# Patient Record
Sex: Female | Born: 1985 | Race: White | Hispanic: No | Marital: Married | Smoking: Never smoker
Health system: Southern US, Community
[De-identification: ages and names within clinical notes are randomized; demographics above are authoritative.]

## PROBLEM LIST (undated history)

## (undated) DIAGNOSIS — R112 Nausea with vomiting, unspecified: Secondary | ICD-10-CM

## (undated) DIAGNOSIS — Z9889 Other specified postprocedural states: Secondary | ICD-10-CM

## (undated) DIAGNOSIS — F419 Anxiety disorder, unspecified: Secondary | ICD-10-CM

## (undated) DIAGNOSIS — F32A Depression, unspecified: Secondary | ICD-10-CM

## (undated) DIAGNOSIS — F329 Major depressive disorder, single episode, unspecified: Secondary | ICD-10-CM

## (undated) HISTORY — PX: KNEE SURGERY: SHX244

## (undated) HISTORY — PX: CHOLECYSTECTOMY: SHX55

## (undated) HISTORY — DX: Depression, unspecified: F32.A

## (undated) HISTORY — DX: Anxiety disorder, unspecified: F41.9

## (undated) HISTORY — DX: Major depressive disorder, single episode, unspecified: F32.9

## (undated) SURGERY — Surgical Case
Anesthesia: *Unknown

---
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
## 2005-12-07 ENCOUNTER — Other Ambulatory Visit: Admission: RE | Admit: 2005-12-07 | Discharge: 2005-12-07 | Payer: Self-pay | Admitting: Obstetrics & Gynecology

## 2006-06-30 ENCOUNTER — Emergency Department: Payer: Self-pay | Admitting: Emergency Medicine

## 2006-12-23 ENCOUNTER — Other Ambulatory Visit: Admission: RE | Admit: 2006-12-23 | Discharge: 2006-12-23 | Payer: Self-pay | Admitting: Obstetrics and Gynecology

## 2008-03-27 ENCOUNTER — Inpatient Hospital Stay (HOSPITAL_COMMUNITY): Admission: AD | Admit: 2008-03-27 | Discharge: 2008-03-27 | Payer: Self-pay | Admitting: Obstetrics and Gynecology

## 2008-04-13 ENCOUNTER — Inpatient Hospital Stay (HOSPITAL_COMMUNITY): Admission: AD | Admit: 2008-04-13 | Discharge: 2008-04-14 | Payer: Self-pay | Admitting: Obstetrics and Gynecology

## 2008-04-17 ENCOUNTER — Ambulatory Visit (HOSPITAL_COMMUNITY): Admission: RE | Admit: 2008-04-17 | Discharge: 2008-04-17 | Payer: Self-pay | Admitting: Obstetrics and Gynecology

## 2008-05-13 ENCOUNTER — Inpatient Hospital Stay (HOSPITAL_COMMUNITY): Admission: AD | Admit: 2008-05-13 | Discharge: 2008-05-15 | Payer: Self-pay | Admitting: Obstetrics and Gynecology

## 2008-06-06 ENCOUNTER — Encounter (INDEPENDENT_AMBULATORY_CARE_PROVIDER_SITE_OTHER): Payer: Self-pay | Admitting: General Surgery

## 2008-06-06 ENCOUNTER — Ambulatory Visit (HOSPITAL_COMMUNITY): Admission: RE | Admit: 2008-06-06 | Discharge: 2008-06-06 | Payer: Self-pay | Admitting: General Surgery

## 2008-12-20 ENCOUNTER — Emergency Department (HOSPITAL_COMMUNITY): Admission: EM | Admit: 2008-12-20 | Discharge: 2008-12-20 | Payer: Self-pay | Admitting: Family Medicine

## 2009-04-02 ENCOUNTER — Ambulatory Visit: Payer: Self-pay | Admitting: Family

## 2009-04-02 ENCOUNTER — Inpatient Hospital Stay (HOSPITAL_COMMUNITY): Admission: AD | Admit: 2009-04-02 | Discharge: 2009-04-02 | Payer: Self-pay | Admitting: Obstetrics and Gynecology

## 2009-10-09 ENCOUNTER — Inpatient Hospital Stay (HOSPITAL_COMMUNITY): Admission: AD | Admit: 2009-10-09 | Discharge: 2009-10-11 | Payer: Self-pay | Admitting: Obstetrics and Gynecology

## 2010-01-08 ENCOUNTER — Ambulatory Visit: Payer: Self-pay | Admitting: Family Medicine

## 2010-01-08 DIAGNOSIS — F329 Major depressive disorder, single episode, unspecified: Secondary | ICD-10-CM

## 2010-01-08 DIAGNOSIS — F3289 Other specified depressive episodes: Secondary | ICD-10-CM | POA: Insufficient documentation

## 2010-01-08 DIAGNOSIS — R42 Dizziness and giddiness: Secondary | ICD-10-CM | POA: Insufficient documentation

## 2010-01-08 LAB — CONVERTED CEMR LAB
BUN: 11 mg/dL (ref 6–23)
Basophils Absolute: 0 10*3/uL (ref 0.0–0.1)
Basophils Relative: 0.6 % (ref 0.0–3.0)
CO2: 29 meq/L (ref 19–32)
Calcium: 9.6 mg/dL (ref 8.4–10.5)
Chloride: 105 meq/L (ref 96–112)
Creatinine, Ser: 0.8 mg/dL (ref 0.4–1.2)
Eosinophils Absolute: 0.1 10*3/uL (ref 0.0–0.7)
Eosinophils Relative: 2.7 % (ref 0.0–5.0)
GFR calc non Af Amer: 99.15 mL/min (ref >60)
Glucose, Bld: 83 mg/dL (ref 70–99)
HCT: 35.8 % — ABNORMAL LOW (ref 36.0–46.0)
Hemoglobin: 12.1 g/dL (ref 12.0–15.0)
Lymphocytes Relative: 40.4 % (ref 12.0–46.0)
Lymphs Abs: 1.5 10*3/uL (ref 0.7–4.0)
MCHC: 33.8 g/dL (ref 30.0–36.0)
MCV: 80.9 fL (ref 78.0–100.0)
Monocytes Absolute: 0.3 10*3/uL (ref 0.1–1.0)
Monocytes Relative: 7.4 % (ref 3.0–12.0)
Neutro Abs: 1.8 10*3/uL (ref 1.4–7.7)
Neutrophils Relative %: 48.9 % (ref 43.0–77.0)
Platelets: 216 10*3/uL (ref 150.0–400.0)
Potassium: 4 meq/L (ref 3.5–5.1)
RBC: 4.42 M/uL (ref 3.87–5.11)
RDW: 16.1 % — ABNORMAL HIGH (ref 11.5–14.6)
Sodium: 141 meq/L (ref 135–145)
TSH: 1.94 microintl units/mL (ref 0.35–5.50)
WBC: 3.6 10*3/uL — ABNORMAL LOW (ref 4.5–10.5)

## 2010-06-04 ENCOUNTER — Ambulatory Visit: Admit: 2010-06-04 | Payer: Self-pay | Admitting: Family Medicine

## 2010-06-16 NOTE — Assessment & Plan Note (Signed)
Summary: NEW TO EST/KN   Vital Signs:  Patient profile:   25 year old female Height:      64 inches Weight:      125 pounds BMI:     21.53 Pulse rate:   74 / minute BP sitting:   110 / 70  (left arm)  Vitals Entered By: Doristine Devoid CMA (January 08, 2010 10:12 AM) CC: NEW EST- sometimes slight lightheadedness throughout the day    History of Present Illness: 25 yo woman here today to establish care.  previous MD- none.  [facility]- [facility].  lightheaded- sxs started 2 weeks ago, occuring multiple times daily.  'like i'm out of it for a second and then i feel fine'.  denies feeling as if she's going to pass out.  'feels like a buzz'.  occurs when standing, changing positions or turning head.  not breast feeding.  limited fluids.  eating regularly but has never eaten breakfast.  doesn't occur at 1 particular time during the day.  denies focal weakness, vertigo.  was started on iron in the hospital but never took it due to complications w/ her son.  depression- previously on Zoloft, hasn't taken in 1-2 weeks.  feels 'fine' w/out it.  no thoughts of hurting herself or babies.  Preventive Screening-Counseling & Management  Alcohol-Tobacco     Alcohol drinks/day: 0     Smoking Status: never  Caffeine-Diet-Exercise     Does Patient Exercise: no      Sexual History:  currently monogamous.        Drug Use:  never.    Current Medications (verified): 1)  Mirena 20 Mcg/24hr Iud (Levonorgestrel) .... As Directed  Allergies (verified): 1)  ! Macrobid  Past History:  Past Medical History: hx of chicken pox Depression Mirena IUD  Past Surgical History: Cholecystectomy ACL reconstruction 2003  Family History: CAD-no HTN-mother DM-no STROKE-no COLON CA-no BREAST CA-maternal great grandmother  Social History: married 2 boys (12/09, 5/11) Dental Assistant- [employer]Smoking Status:  never Does Patient Exercise:  no Sexual History:  currently  monogamous Drug Use:  never  Review of Systems      See HPI  Physical Exam  General:  Well-developed,well-nourished,in no acute distress; alert,appropriate and cooperative throughout examination Neck:  No deformities, masses, or tenderness noted. Lungs:  Normal respiratory effort, chest expands symmetrically. Lungs are clear to auscultation, no crackles or wheezes. Heart:  Normal rate and regular rhythm. S1 and S2 normal without gallop, murmur, click, rub or other extra sounds. Pulses:  +2 carotid, radial, DP Extremities:  no C/C/E Neurologic:  alert & oriented X3, cranial nerves II-XII intact, strength normal in all extremities, gait normal, DTRs symmetrical and normal, and Romberg negative.   Psych:  Cognition and judgment appear intact. Alert and cooperative with normal attention span and concentration. No apparent delusions, illusions, hallucinations   Impression & Recommendations:  Problem # 1:  DIZZINESS (ICD-780.4) Assessment New pt's PE WNL.  check labs.  most likely due to low BP and poor fluid intake.  strongly encouraged pt to increase fluids, liberalize salt, change positions slowly.  if no improvement, pt to call.  will follow. Orders: Venipuncture (16109) TLB-BMP (Basic Metabolic Panel-BMET) (80048-METABOL) TLB-CBC Platelet - w/Differential (85025-CBCD) TLB-TSH (Thyroid Stimulating Hormone) (84443-TSH) Specimen Handling (60454)  Problem # 2:  DEPRESSION (ICD-311) Assessment: New pt asymptomatic currently.  no evidence of post-partum.  will follow.  Complete Medication List: 1)  Mirena 20 Mcg/24hr Iud (Levonorgestrel) .... As directed  Patient  Instructions: 1)  Call if no improvement in the next 1-2 weeks 2)  Increase your fluid intake- goal is 1 bottle of water in morning and afternoon 3)  Eat some salt! 4)  Change positions slowly to allow your body time to adjust 5)  We'll notify you of your lab results 6)  Welcome we're glad to have you! 7)  Hang in  there!

## 2010-07-02 ENCOUNTER — Encounter: Payer: Self-pay | Admitting: Internal Medicine

## 2010-07-02 ENCOUNTER — Ambulatory Visit (INDEPENDENT_AMBULATORY_CARE_PROVIDER_SITE_OTHER): Payer: BC Managed Care – PPO | Admitting: Internal Medicine

## 2010-07-02 DIAGNOSIS — J069 Acute upper respiratory infection, unspecified: Secondary | ICD-10-CM

## 2010-07-02 DIAGNOSIS — J209 Acute bronchitis, unspecified: Secondary | ICD-10-CM

## 2010-07-08 NOTE — Assessment & Plan Note (Signed)
Summary: BAD COUGH/RH   Vital Signs:  Patient profile:   25 year old female Weight:      125 pounds BMI:     21.53 Temp:     98.5 degrees F oral Pulse rate:   60 / minute Resp:     14 per minute BP sitting:   98 / 62  (left arm) Cuff size:   regular  Vitals Entered By: Shonna Chock CMA (July 02, 2010 3:25 PM) CC: Bad cough, hurts when breathing. FYI:  53yr old daughter is getting over RSV, Cough, URI symptoms   CC:  Bad cough, hurts when breathing. FYI:  67yr old daughter is getting over RSV, Cough, and URI symptoms.  History of Present Illness:    Onset 02/14 as NP cough; her 25 yo has RSV. No PMH of asthma; she is non smoker. She  denies productive cough, shortness of breath, wheezing, exertional dyspnea, fever, and hemoptysis.  Associated symtpoms include chronic rhinitis.  She reports purulent nasal discharge, but denies sore throat ( "just scratchy").  The patient denies fever  and wheezing.  The patient also reports frontal headache.  The patient denies bilateral facial pain, tooth pain, and tender adenopathy.  Rx: Tylenol  Current Medications (verified): 1)  Mirena 20 Mcg/24hr Iud (Levonorgestrel) .... As Directed  Allergies: 1)  ! Macrobid  Physical Exam  General:  Appears tired ,in no acute distress; alert,appropriate and cooperative throughout examination Ears:  External ear exam shows no significant lesions or deformities.  Otoscopic examination reveals  wax bilaterally . Hearing is grossly normal bilaterally. Nose:  External nasal examination shows no deformity or inflammation. Nasal mucosa are pink and moist without lesions or exudates. Hyponasal speech; sniffling Mouth:  Oral mucosa and oropharynx without lesions or exudates.  Teeth in good repair. Lungs:  Normal respiratory effort, chest expands symmetrically. Lungs are clear to auscultation, no crackles or wheezes. Heart:  Normal rate and regular rhythm. S1 and S2 normal without gallop, murmur, click, rub  .S4 Skin:  Intact without suspicious lesions or rashes Cervical Nodes:  No lymphadenopathy noted Axillary Nodes:  No palpable lymphadenopathy   Impression & Recommendations:  Problem # 1:  BRONCHITIS-ACUTE (ICD-466.0)  RSV exposure  Her updated medication list for this problem includes:    Azithromycin 250 Mg Tabs (Azithromycin) .Marland Kitchen... As per pack    Hydromet 5-1.5 Mg/42ml Syrp (Hydrocodone-homatropine) .Marland Kitchen... 1 tsp every 6 hrs as needed  Problem # 2:  URI (ICD-465.9)  Her updated medication list for this problem includes:    Hydromet 5-1.5 Mg/3ml Syrp (Hydrocodone-homatropine) .Marland Kitchen... 1 tsp every 6 hrs as needed  Complete Medication List: 1)  Mirena 20 Mcg/24hr Iud (Levonorgestrel) .... As directed 2)  Azithromycin 250 Mg Tabs (Azithromycin) .... As per pack 3)  Hydromet 5-1.5 Mg/34ml Syrp (Hydrocodone-homatropine) .Marland Kitchen.. 1 tsp every 6 hrs as needed  Patient Instructions: 1)  Neti pot once daily - two times a day as needed  2)  Drink as much  NON dairy fluid as you can tolerate for the next few days. Prescriptions: HYDROMET 5-1.5 MG/5ML SYRP (HYDROCODONE-HOMATROPINE) 1 tsp every 6 hrs as needed  #120 cc x 0   Entered and Authorized by:   Marga Melnick MD   Signed by:   Marga Melnick MD on 07/02/2010   Method used:   Print then Give to Patient   RxID:   [id number] AZITHROMYCIN 250 MG TABS (AZITHROMYCIN) as per pack  #1 x 0   Entered and Authorized by:  Marga Melnick MD   Signed by:   Marga Melnick MD on 07/02/2010   Method used:   Print then Give to Patient   RxID:   [id number]    Orders Added: 1)  Est. Patient Level III [41660]

## 2010-07-16 ENCOUNTER — Emergency Department (HOSPITAL_COMMUNITY)
Admission: EM | Admit: 2010-07-16 | Discharge: 2010-07-17 | Disposition: A | Discharge status: Home / Self Care | Payer: BC Managed Care – PPO | Attending: Emergency Medicine | Admitting: Emergency Medicine

## 2010-07-16 DIAGNOSIS — R509 Fever, unspecified: Secondary | ICD-10-CM | POA: Insufficient documentation

## 2010-07-16 DIAGNOSIS — R112 Nausea with vomiting, unspecified: Secondary | ICD-10-CM | POA: Insufficient documentation

## 2010-07-16 DIAGNOSIS — B9789 Other viral agents as the cause of diseases classified elsewhere: Secondary | ICD-10-CM | POA: Insufficient documentation

## 2010-07-16 DIAGNOSIS — R300 Dysuria: Secondary | ICD-10-CM | POA: Insufficient documentation

## 2010-07-16 LAB — URINALYSIS, ROUTINE W REFLEX MICROSCOPIC
Hgb urine dipstick: NEGATIVE
Nitrite: NEGATIVE
Protein, ur: NEGATIVE mg/dL
Specific Gravity, Urine: 1.027 (ref 1.005–1.030)
Urine Glucose, Fasting: NEGATIVE mg/dL
Urobilinogen, UA: 0.2 mg/dL (ref 0.0–1.0)
pH: 5.5 (ref 5.0–8.0)

## 2010-07-16 LAB — DIFFERENTIAL
Basophils Absolute: 0 10*3/uL (ref 0.0–0.1)
Basophils Relative: 0 % (ref 0–1)
Eosinophils Absolute: 0 10*3/uL (ref 0.0–0.7)
Eosinophils Relative: 0 % (ref 0–5)
Lymphocytes Relative: 3 % — ABNORMAL LOW (ref 12–46)
Lymphs Abs: 0.3 10*3/uL — ABNORMAL LOW (ref 0.7–4.0)
Monocytes Absolute: 0.5 10*3/uL (ref 0.1–1.0)
Monocytes Relative: 5 % (ref 3–12)
Neutro Abs: 9.2 10*3/uL — ABNORMAL HIGH (ref 1.7–7.7)
Neutrophils Relative %: 92 % — ABNORMAL HIGH (ref 43–77)

## 2010-07-16 LAB — CBC
HCT: 42.1 % (ref 36.0–46.0)
Hemoglobin: 14 g/dL (ref 12.0–15.0)
MCH: 28.5 pg (ref 26.0–34.0)
MCHC: 33.3 g/dL (ref 30.0–36.0)
MCV: 85.7 fL (ref 78.0–100.0)
Platelets: 185 10*3/uL (ref 150–400)
RBC: 4.91 MIL/uL (ref 3.87–5.11)
RDW: 13.9 % (ref 11.5–15.5)
WBC: 10 10*3/uL (ref 4.0–10.5)

## 2010-07-16 LAB — PREGNANCY, URINE: Preg Test, Ur: NEGATIVE

## 2010-07-16 LAB — POCT I-STAT, CHEM 8
BUN: 15 mg/dL (ref 6–23)
Calcium, Ion: 1.12 mmol/L (ref 1.12–1.32)
Chloride: 104 mEq/L (ref 96–112)
Creatinine, Ser: 0.8 mg/dL (ref 0.4–1.2)
Glucose, Bld: 111 mg/dL — ABNORMAL HIGH (ref 70–99)
HCT: 44 % (ref 36.0–46.0)
Hemoglobin: 15 g/dL (ref 12.0–15.0)
Potassium: 3.2 mEq/L — ABNORMAL LOW (ref 3.5–5.1)
Sodium: 140 mEq/L (ref 135–145)
TCO2: 23 mmol/L (ref 0–100)

## 2010-07-16 LAB — URINE MICROSCOPIC-ADD ON

## 2010-07-31 ENCOUNTER — Encounter: Payer: Self-pay | Admitting: Family Medicine

## 2010-07-31 ENCOUNTER — Ambulatory Visit (INDEPENDENT_AMBULATORY_CARE_PROVIDER_SITE_OTHER): Payer: BC Managed Care – PPO | Admitting: Family Medicine

## 2010-07-31 DIAGNOSIS — J309 Allergic rhinitis, unspecified: Secondary | ICD-10-CM

## 2010-07-31 DIAGNOSIS — R51 Headache: Secondary | ICD-10-CM

## 2010-07-31 DIAGNOSIS — R519 Headache, unspecified: Secondary | ICD-10-CM | POA: Insufficient documentation

## 2010-08-03 LAB — CBC
HCT: 24.4 % — ABNORMAL LOW (ref 36.0–46.0)
HCT: 32.4 % — ABNORMAL LOW (ref 36.0–46.0)
Hemoglobin: 10.7 g/dL — ABNORMAL LOW (ref 12.0–15.0)
Hemoglobin: 8.1 g/dL — ABNORMAL LOW (ref 12.0–15.0)
MCHC: 33.2 g/dL (ref 30.0–36.0)
MCHC: 33.4 g/dL (ref 30.0–36.0)
MCV: 75.5 fL — ABNORMAL LOW (ref 78.0–100.0)
MCV: 75.7 fL — ABNORMAL LOW (ref 78.0–100.0)
Platelets: 196 10*3/uL (ref 150–400)
Platelets: 235 10*3/uL (ref 150–400)
RBC: 3.22 MIL/uL — ABNORMAL LOW (ref 3.87–5.11)
RBC: 4.28 MIL/uL (ref 3.87–5.11)
RDW: 17.4 % — ABNORMAL HIGH (ref 11.5–15.5)
RDW: 17.6 % — ABNORMAL HIGH (ref 11.5–15.5)
WBC: 10.4 10*3/uL (ref 4.0–10.5)
WBC: 9.6 10*3/uL (ref 4.0–10.5)

## 2010-08-03 LAB — RPR: RPR Ser Ql: NONREACTIVE

## 2010-08-04 NOTE — Assessment & Plan Note (Signed)
Summary: hosp f/up 3/1---vomiting , dehydration, now has constant head...   Vital Signs:  Patient profile:   25 year old female Height:      64 inches Weight:      122 pounds Temp:     98.2 degrees F oral Pulse rate:   68 / minute BP sitting:   100 / 58  (left arm)  Vitals Entered By: Jeremy Johann CMA (July 31, 2010 3:46 PM) CC: hosp f/u dehydration, costant headache sine ED   History of Present Illness: 25 yo woman here today for hospital f/u after gastroenteritis.  went to ER on 3/1 and was given IV fluids.  has had a headache every day since.  has been trying to drink more fluids.  HAs are bitemporal and frontal.  had 1 migraine in last 3 weeks (2nd ever).  no longer having nausea, vomiting, diarrhea.  denies hx of seasonal allergies- denies nasal congestion, sneezing.  no fevers.    Current Medications (verified): 1)  Mirena 20 Mcg/24hr Iud (Levonorgestrel) .... As Directed  Allergies (verified): 1)  ! Macrobid  Review of Systems      See HPI  Physical Exam  General:  in no acute distress; alert,appropriate and cooperative throughout examination Head:  NCAT, no TTP over frontal sinuses, mild discomfort w/ palpation of maxillary sinuses Eyes:  no injxn or inflammation Ears:  External ear exam shows no significant lesions or deformities.  Otoscopic examination reveals  wax bilaterally . Hearing is grossly normal bilaterally. Nose:  + turbinate edema, L>R Mouth:  Oral mucosa and oropharynx without lesions or exudates.  Teeth in good repair. Neck:  No deformities, masses, or tenderness noted. Lungs:  Normal respiratory effort, chest expands symmetrically. Lungs are clear to auscultation, no crackles or wheezes. Heart:  Normal rate and regular rhythm. S1 and S2 normal without gallop, murmur, click, rub or other extra sounds. Abdomen:  soft, NT/ND, +BS Neurologic:  alert & oriented X3, cranial nerves II-XII intact, strength normal in all extremities, gait normal, DTRs  symmetrical and normal, and Romberg negative.     Impression & Recommendations:  Problem # 1:  HEADACHE (ICD-784.0) Assessment New pt's HA appears to be sinus/allergy related given turbinate edema and lack of neuro sxs.  start allergy meds, tylenol/ibuprofen as needed.  reviewed supportive care and red flags that should prompt return.  Pt expresses understanding and is in agreement w/ this plan.  Problem # 2:  RHINITIS (ICD-477.9) Assessment: New see above  Complete Medication List: 1)  Mirena 20 Mcg/24hr Iud (Levonorgestrel) .... As directed  Patient Instructions: 1)  This appears to be an allergy/sinus headache 2)  Take Zyrtec daily for nasal and sinus congestion 3)  Drink plenty of fluids 4)  Continue tylenol/ibuprofen as needed for headaches 5)  Call with any questions or concerns 6)  Hang in there!!!   Orders Added: 1)  Est. Patient Level III [16109]

## 2010-08-12 ENCOUNTER — Encounter: Payer: Self-pay | Admitting: Family Medicine

## 2010-08-13 ENCOUNTER — Ambulatory Visit (INDEPENDENT_AMBULATORY_CARE_PROVIDER_SITE_OTHER): Payer: BC Managed Care – PPO | Admitting: Family Medicine

## 2010-08-13 ENCOUNTER — Encounter: Payer: Self-pay | Admitting: Family Medicine

## 2010-08-13 VITALS — BP 100/60 | Temp 98.0°F | Ht 64.0 in | Wt 126.1 lb

## 2010-08-13 DIAGNOSIS — F3289 Other specified depressive episodes: Secondary | ICD-10-CM

## 2010-08-13 DIAGNOSIS — F32A Depression, unspecified: Secondary | ICD-10-CM

## 2010-08-13 DIAGNOSIS — F329 Major depressive disorder, single episode, unspecified: Secondary | ICD-10-CM

## 2010-08-13 DIAGNOSIS — F419 Anxiety disorder, unspecified: Secondary | ICD-10-CM | POA: Insufficient documentation

## 2010-08-13 MED ORDER — CITALOPRAM HYDROBROMIDE 20 MG PO TABS: 20.0000 mg | ORAL_TABLET | Freq: Every day | ORAL | Status: DC

## 2010-08-13 NOTE — Patient Instructions (Signed)
Follow up in 4-6 weeks to recheck mood Start the Celexa daily- if you find it makes you sleepy, take it at dinner Give yourself a break!  You have a lot going on! (and you're completely normal!) Call with any questions or concerns Hang in there!

## 2010-08-13 NOTE — Progress Notes (Signed)
  Subjective:    Patient ID: Renee Patterson, female    DOB: 1986/05/09, 25 y.o.   MRN: [id number]  HPI Depression- worse over the last 2 weeks.  'snapping at husband all the time', 'bad mood all the time'.  Started on Zoloft during 2nd pregnancy b/c she was overwhelmed.  Stopped meds 2 months ago.  Mom of 2 young boys, in school, working full time- 'everything is getting to me'.  Was wondering if it could be side effect of mirena- has been in place for 9 months.  After stopping zoloft initially felt 'really good'.  No family hx of depression.  Restarted school in January.   Review of Systems For ROS see HPI     Objective:   Physical Exam  Constitutional: She appears well-developed and well-nourished. No distress.  Psychiatric: Her speech is normal and behavior is normal. Judgment and thought content normal. Her mood appears anxious. Cognition and memory are normal.          Assessment & Plan:

## 2010-08-13 NOTE — Assessment & Plan Note (Signed)
Pt's depression has likely recurred due to amount of stress in life.  'i don't like myself right now'.  Open to idea of restarting medication.  Will switch to Celexa now that pt is not breast feeding.  Will follow closely.

## 2010-08-19 LAB — GC/CHLAMYDIA PROBE AMP, GENITAL
Chlamydia, DNA Probe: NEGATIVE
GC Probe Amp, Genital: NEGATIVE

## 2010-08-19 LAB — URINE CULTURE
Colony Count: NO GROWTH
Culture: NO GROWTH

## 2010-08-19 LAB — WET PREP, GENITAL
Clue Cells Wet Prep HPF POC: NONE SEEN
Trich, Wet Prep: NONE SEEN
Yeast Wet Prep HPF POC: NONE SEEN

## 2010-08-19 LAB — URINE MICROSCOPIC-ADD ON

## 2010-08-19 LAB — URINALYSIS, ROUTINE W REFLEX MICROSCOPIC
Bilirubin Urine: NEGATIVE
Glucose, UA: NEGATIVE mg/dL
Hgb urine dipstick: NEGATIVE
Ketones, ur: NEGATIVE mg/dL
Nitrite: NEGATIVE
Protein, ur: NEGATIVE mg/dL
Specific Gravity, Urine: 1.01 (ref 1.005–1.030)
Urobilinogen, UA: 0.2 mg/dL (ref 0.0–1.0)
pH: 7.5 (ref 5.0–8.0)

## 2010-08-23 LAB — POCT RAPID STREP A (OFFICE): Streptococcus, Group A Screen (Direct): NEGATIVE

## 2010-08-31 LAB — COMPREHENSIVE METABOLIC PANEL
ALT: 78 U/L — ABNORMAL HIGH (ref 0–35)
AST: 32 U/L (ref 0–37)
Albumin: 4 g/dL (ref 3.5–5.2)
Alkaline Phosphatase: 107 U/L (ref 39–117)
BUN: 12 mg/dL (ref 6–23)
CO2: 28 mEq/L (ref 19–32)
Calcium: 9.4 mg/dL (ref 8.4–10.5)
Chloride: 106 mEq/L (ref 96–112)
Creatinine, Ser: 0.76 mg/dL (ref 0.4–1.2)
GFR calc Af Amer: >60 mL/min (ref >60)
GFR calc non Af Amer: >60 mL/min (ref >60)
Glucose, Bld: 89 mg/dL (ref 70–99)
Potassium: 4.2 mEq/L (ref 3.5–5.1)
Sodium: 141 mEq/L (ref 135–145)
Total Bilirubin: 0.7 mg/dL (ref 0.3–1.2)
Total Protein: 6.7 g/dL (ref 6.0–8.3)

## 2010-08-31 LAB — CBC
HCT: 33.1 % — ABNORMAL LOW (ref 36.0–46.0)
Hemoglobin: 11 g/dL — ABNORMAL LOW (ref 12.0–15.0)
MCHC: 33.3 g/dL (ref 30.0–36.0)
MCV: 85.9 fL (ref 78.0–100.0)
Platelets: 204 10*3/uL (ref 150–400)
RBC: 3.85 MIL/uL — ABNORMAL LOW (ref 3.87–5.11)
RDW: 14.7 % (ref 11.5–15.5)
WBC: 3.9 10*3/uL — ABNORMAL LOW (ref 4.0–10.5)

## 2010-08-31 LAB — PREGNANCY, URINE: Preg Test, Ur: NEGATIVE

## 2010-08-31 LAB — DIFFERENTIAL
Basophils Absolute: 0 10*3/uL (ref 0.0–0.1)
Basophils Relative: 1 % (ref 0–1)
Eosinophils Absolute: 0.1 10*3/uL (ref 0.0–0.7)
Eosinophils Relative: 4 % (ref 0–5)
Lymphocytes Relative: 33 % (ref 12–46)
Lymphs Abs: 1.3 10*3/uL (ref 0.7–4.0)
Monocytes Absolute: 0.3 10*3/uL (ref 0.1–1.0)
Monocytes Relative: 7 % (ref 3–12)
Neutro Abs: 2.2 10*3/uL (ref 1.7–7.7)
Neutrophils Relative %: 56 % (ref 43–77)

## 2010-09-29 NOTE — Op Note (Signed)
Renee Patterson, Renee Patterson                  ACCOUNT NO.:  [id number]   MEDICAL RECORD NO.:  [id number]          PATIENT TYPE:  AMB   LOCATION:  DAY                          FACILITY[facility]  [facility]   PHYSICIAN:  Juanetta Gosling, MDDATE OF BIRTH:  [birth year]   DATE OF PROCEDURE:  06/06/2008  DATE OF DISCHARGE:                               OPERATIVE REPORT   PREOPERATIVE DIAGNOSES:  1. Biliary colic.  2. Elevated liver function tests.   POSTOPERATIVE DIAGNOSES:  1. Biliary colic.  2. Elevated liver function tests.   PROCEDURE:  Laparoscopic cholecystectomy with cholangiogram.   SURGEON:  Juanetta Gosling, MD   ASSISTANT:  Velora Heckler, MD   ANESTHESIA:  General.   FINDINGS:  Normal cholangiogram.   SPECIMEN:  Gallbladder and contents to pathology.   ESTIMATED BLOOD LOSS:  Minimal.   COMPLICATIONS:  None.   DRAINS:  None.   DISPOSITION:  To recovery room in stable condition.   INDICATIONS:  Mrs. Kil is a 25 year old female whom I initially saw at  [redacted] weeks pregnant with classic symptoms of biliary colic.  We planned to  do a laparoscopic cholecystectomy after she delivered.  She delivered 3  weeks ago and now is having consistent right upper quadrant pain.  Her  ultrasound shows numerous tiny gallstones.  She did have a mildly  elevated transaminases and alkaline phosphatase with a normal total  bilirubin preoperatively, so I counseled her for laparoscopic  cholecystectomy with a cholangiogram.   PROCEDURE:  After informed consent was obtained, the patient was taken  to the operating room where she was administered 1 gram of cefoxitin.  Sequential compression devices were placed on lower extremites prior to  induction.  She was placed under general endotracheal anesthesia without  complication.  Her abdomen was then prepped and draped in a sterile  surgical fashion.  Surgical time-out was then performed.   A 10-mm vertical infraumbilical incision was then made, carried out  down  to the level of fascia which was incised sharply.  Peritoneum was then  entered bluntly.  Zero Vicryl pursestring suture was placed in her  fascia and a Hasson trocar was introduced.  Her abdomen was then  insufflated to 15 mmHg pressure.  Three further 5-mm ports were then  placed in her epigastrium and right upper quadrant after infiltration  with local anesthetic under direct vision.  Her gallbladder was then  retracted cephalad and lateral.  The triangle of Calot was then  dissected.  The cystic duct and cystic artery were both clearly  identified obtaining the critical view of safety.  Due to her abnormal  LFTs, I did perform a cholangiogram.  A clip was placed on the distal  cystic duct.  A ductotomy was then made with scissors and a Cook  catheter was introduced into the cystic duct and clipped into place a  cholangiogram was then performed which showed no evidence of any stones,  filling of both hepatic radicles, filling of the duodenum and the  position of the catheter in the cystic duct.  The catheter was then  removed, clips were applied to the cystic duct, the cystic duct was then  divided, the cystic artery was then encircled and clipped and divided as  well.  The gallbladder was then removed from the liver bed with the  electrocautery.  There were some small areas of bleeding and hemostasis  was obtained.  One of the clips fell off the gallbladder.  There was a  small amount of leakage of bile during this portion as well.  Following  this, the gallbladder was then placed in the EndoCatch and removed out  of the umbilicus and passed off the table as specimen.  Copious  irrigation was performed until this was clear.  Hemostasis was observed.  The umbilical incision was then tied down.  I did place an additional  figure-of-eight stitch with a 0 Vicryl in the umbilical incision to  close her fascia.  Following this, all ports were removed and the  abdomen was desufflated.   4-0 Monocryl in subcuticular fashion was used  to close all the wounds and Dermabond was placed over all the wounds.  She tolerated this well, was extubated in the operating room and was  transferred to the recovery room in stable condition.      Juanetta Gosling, MD  Electronically Signed     MCW/MEDQ  D:  06/06/2008  T:  06/06/2008  Job:  [id number]   cc:   Dineen Kid. Rana Snare, M.D.  Fax: [phone]

## 2010-12-07 ENCOUNTER — Encounter: Payer: Self-pay | Admitting: Family Medicine

## 2010-12-07 ENCOUNTER — Ambulatory Visit (INDEPENDENT_AMBULATORY_CARE_PROVIDER_SITE_OTHER): Payer: BC Managed Care – PPO | Admitting: Family Medicine

## 2010-12-07 VITALS — BP 100/70 | Temp 99.0°F | Wt 120.0 lb

## 2010-12-07 DIAGNOSIS — D485 Neoplasm of uncertain behavior of skin: Secondary | ICD-10-CM | POA: Insufficient documentation

## 2010-12-07 NOTE — Assessment & Plan Note (Signed)
Pt's hypopigmented area appears to be sunburned which would account for the tenderness to touch.  Given family hx of skin cancer and obvious changes to area, will refer to derm for complete evaluation and tx.  Pt expressed understanding and is in agreement w/ plan.

## 2010-12-07 NOTE — Progress Notes (Signed)
  Subjective:    Patient ID: Renee Patterson, female    DOB: [birth year], 25 y.o.   MRN: [id number]  HPI Irritated mole- has had mole for 'as long as i can remember'.  2-3 yrs ago after tanning noticed white halo around mole.  Subsequently noticed a faint black dot.  In-laws this weekend noticed that mole 'was gone' and pale pink spot is now in it's place- white halo remains.  Now tender to touch.  No bleeding, no scab or irregular texture.   Review of Systems For ROS see HPI     Objective:   Physical Exam  Constitutional: She appears well-developed and well-nourished. No distress.  Skin:             Assessment & Plan:

## 2010-12-07 NOTE — Patient Instructions (Signed)
We'll notify you of your derm appt There is nothing you need to do- make sure that you apply sunscreen to that area Call with any questions or concerns Have a great summer!

## 2010-12-09 ENCOUNTER — Ambulatory Visit: Payer: BC Managed Care – PPO | Admitting: Family Medicine

## 2010-12-25 ENCOUNTER — Other Ambulatory Visit: Payer: Self-pay | Admitting: Surgery

## 2011-01-06 ENCOUNTER — Telehealth: Payer: Self-pay | Admitting: Family Medicine

## 2011-01-06 NOTE — Telephone Encounter (Signed)
Pt was able to remember things at her appt ~1 month ago (was able to describe her vacation in detail).  Since this is obviously a change for her i recommended she go to ER.  Pt refused.  Find this odd b/c it was bothersome enough for pt to call but then told Karoline Caldwell that 'it's not a big problem, it's just weird'.  Please call her in the morning and check on her to ensure she is ok.

## 2011-01-06 NOTE — Telephone Encounter (Signed)
Pt calls with complaint of memory loss. Pt can't remember any of her childhood,high school graduation ex.  Pt says it's been going on for as long as she can remember. Spoke with Dr. Beverely Low, was advised to tell patient to go to ER. Pt refused stating that she didn't think this was "that serious."

## 2011-01-08 NOTE — Telephone Encounter (Signed)
Left message to call office

## 2011-01-11 NOTE — Telephone Encounter (Signed)
Pt states that she is doing fine and it was nothing that happen all the sudden. Pt indicate that over time she has notices that she is having a hard time recalling different events from her childhood.Pt notes that this has been on going for a while and she was concern because she did not want to start to forget current events. Pt advise OV would be needed, Pt states that she is currently at doctor with her children will call when she leave there to schedule OV for evaluation

## 2011-02-10 ENCOUNTER — Ambulatory Visit (INDEPENDENT_AMBULATORY_CARE_PROVIDER_SITE_OTHER): Payer: BC Managed Care – PPO | Admitting: Family Medicine

## 2011-02-10 ENCOUNTER — Encounter: Payer: Self-pay | Admitting: Family Medicine

## 2011-02-10 ENCOUNTER — Ambulatory Visit: Payer: BC Managed Care – PPO | Admitting: Family Medicine

## 2011-02-10 VITALS — BP 120/80 | Temp 99.0°F | Wt 120.0 lb

## 2011-02-10 DIAGNOSIS — R413 Other amnesia: Secondary | ICD-10-CM

## 2011-02-10 LAB — CBC WITH DIFFERENTIAL/PLATELET
Basophils Absolute: 0 10*3/uL (ref 0.0–0.1)
Basophils Relative: 0.4 % (ref 0.0–3.0)
Eosinophils Absolute: 0.1 10*3/uL (ref 0.0–0.7)
Eosinophils Relative: 1.9 % (ref 0.0–5.0)
HCT: 40.5 % (ref 36.0–46.0)
Hemoglobin: 13.8 g/dL (ref 12.0–15.0)
Lymphocytes Relative: 18.1 % (ref 12.0–46.0)
Lymphs Abs: 0.9 10*3/uL (ref 0.7–4.0)
MCHC: 34.1 g/dL (ref 30.0–36.0)
MCV: 89.7 fl (ref 78.0–100.0)
Monocytes Absolute: 0.5 10*3/uL (ref 0.1–1.0)
Monocytes Relative: 9.3 % (ref 3.0–12.0)
Neutro Abs: 3.7 10*3/uL (ref 1.4–7.7)
Neutrophils Relative %: 70.3 % (ref 43.0–77.0)
Platelets: 184 10*3/uL (ref 150.0–400.0)
RBC: 4.52 Mil/uL (ref 3.87–5.11)
RDW: 13.6 % (ref 11.5–14.6)
WBC: 5.2 10*3/uL (ref 4.5–10.5)

## 2011-02-10 LAB — BASIC METABOLIC PANEL
BUN: 10 mg/dL (ref 6–23)
Calcium: 9.6 mg/dL (ref 8.4–10.5)
Creatinine, Ser: 0.6 mg/dL (ref 0.4–1.2)

## 2011-02-10 LAB — TSH: TSH: 1.04 u[IU]/mL (ref 0.35–5.50)

## 2011-02-10 NOTE — Progress Notes (Signed)
  Subjective:    Patient ID: Renee Patterson, female    DOB: 1985/07/01, 25 y.o.   MRN: [id number]  HPI Memory loss- reports it's starting to affect work.  Mom has always said pt was 'forgetful'.  Will look at pictures from childhood trips but not remember going.  Graduated HS in 2005- doesn't remember where it was, who attended.  Will talk to a patient at work and 'completely forget what we talked about'.  Pt feels she should remember more than she does.  Is starting to worry that she will forget her children's milestones.  Remembers wedding day, birth of children but 'it's like there are some things in my life that are just blank'.  Will forget to pay bills, 'it's driving my husband crazy'.  Denies difficulty completing tasks, staying focused.  Needs to write things down.   Review of Systems For ROS see HPI     Objective:   Physical Exam  Vitals reviewed. Constitutional: She is oriented to person, place, and time. She appears well-developed and well-nourished. No distress.  Neck: Normal range of motion. Neck supple. No thyromegaly present.  Neurological: She is alert and oriented to person, place, and time. No cranial nerve deficit. Coordination normal.  Skin: Skin is warm and dry.  Psychiatric: She has a normal mood and affect. Her behavior is normal. Judgment and thought content normal.          Assessment & Plan:

## 2011-02-10 NOTE — Assessment & Plan Note (Signed)
Unclear as to cause of pt's sxs.  Given her young age will refer to neuro for complete w/u.  Will check labs to r/o thyroid, vit deficiencies.  Pt expressed understanding and is in agreement w/ plan.

## 2011-02-10 NOTE — Progress Notes (Signed)
Quick Note:    Labs mailed.  ______

## 2011-02-10 NOTE — Patient Instructions (Signed)
We'll call you with your neuro appt We'll notify you of your lab results Hang in there!!!

## 2011-02-12 ENCOUNTER — Ambulatory Visit: Payer: BC Managed Care – PPO | Admitting: Family Medicine

## 2011-02-15 LAB — VITAMIN B1: Vitamin B1 (Thiamine): 12 nmol/L (ref 9–44)

## 2011-02-16 ENCOUNTER — Telehealth: Payer: Self-pay | Admitting: Family Medicine

## 2011-02-16 ENCOUNTER — Telehealth: Payer: Self-pay

## 2011-02-16 NOTE — Telephone Encounter (Signed)
Patient scheduled to see Dr. Modesto Charon 02-26-11 (currently his 1st avail).  She is on his cancellation list, however, patient is calling everyday wanting sooner appointment with Neuro.  Patient is consumed with worry, states her memory is getting even worse.  Once receiving her normal lab results today, she is even more a mess that something bad is wrong.  Per my call with patient this afternoon, she was in tears, and stating how embarrassing this memory loss has become.  Patient is asking if there is anything further Dr. Beverely Low can do to help her right now.  Also, per her request, I am checking with Diane of [facility] to see if they have any cancellations if they will see patient right away.  Please advise.

## 2011-02-16 NOTE — Telephone Encounter (Signed)
Left message to notify pt of results. Pt advised to call with questions or concerns

## 2011-02-16 NOTE — Telephone Encounter (Signed)
Message copied by Beverely Low on Tue Feb 16, 2011 11:36 AM ------      Message from: Sheliah Hatch      Created: Mon Feb 15, 2011  8:35 PM       normal

## 2011-02-17 LAB — COMPREHENSIVE METABOLIC PANEL
ALT: 68 — ABNORMAL HIGH
AST: 82 — ABNORMAL HIGH
Albumin: 2.9 — ABNORMAL LOW
Alkaline Phosphatase: 160 — ABNORMAL HIGH
Chloride: 107
GFR calc Af Amer: >60
Potassium: 3.4 — ABNORMAL LOW
Total Bilirubin: 0.8

## 2011-02-17 LAB — CBC
HCT: 35 — ABNORMAL LOW
Hemoglobin: 12
MCV: 88.8
Platelets: 173
RDW: 13.3
WBC: 8.9

## 2011-02-17 LAB — URINALYSIS, ROUTINE W REFLEX MICROSCOPIC
Glucose, UA: NEGATIVE
Hgb urine dipstick: NEGATIVE
Ketones, ur: NEGATIVE
Protein, ur: NEGATIVE
pH: 7

## 2011-02-17 MED ORDER — ALPRAZOLAM 0.25 MG PO TABS: 0.2500 mg | ORAL_TABLET | ORAL | Status: DC | PRN

## 2011-02-17 NOTE — Telephone Encounter (Signed)
Pt aware per Luster Landsberg

## 2011-02-17 NOTE — Telephone Encounter (Signed)
There is nothing else to do at this time except treat her overwhelming anxiety about this situation.  She can have xanax 0.25mg  Q4 prn for panic but in regards to the memory loss we have to wait for neuro.

## 2011-02-17 NOTE — Telephone Encounter (Signed)
Rx faxed to pharmacy  

## 2011-02-19 LAB — CBC
HCT: 31.8 % — ABNORMAL LOW (ref 36.0–46.0)
HCT: 37.5 % (ref 36.0–46.0)
Hemoglobin: 10.9 g/dL — ABNORMAL LOW (ref 12.0–15.0)
Hemoglobin: 12.6 g/dL (ref 12.0–15.0)
MCHC: 33.5 g/dL (ref 30.0–36.0)
MCHC: 34.4 g/dL (ref 30.0–36.0)
MCV: 88.2 fL (ref 78.0–100.0)
MCV: 88.3 fL (ref 78.0–100.0)
Platelets: 141 10*3/uL — ABNORMAL LOW (ref 150–400)
Platelets: 157 10*3/uL (ref 150–400)
RBC: 3.6 MIL/uL — ABNORMAL LOW (ref 3.87–5.11)
RBC: 4.25 MIL/uL (ref 3.87–5.11)
RDW: 13.8 % (ref 11.5–15.5)
RDW: 13.9 % (ref 11.5–15.5)
WBC: 12.2 10*3/uL — ABNORMAL HIGH (ref 4.0–10.5)
WBC: 9.1 10*3/uL (ref 4.0–10.5)

## 2011-02-19 LAB — RPR: RPR Ser Ql: NONREACTIVE

## 2011-02-22 ENCOUNTER — Telehealth: Payer: Self-pay

## 2011-02-22 NOTE — Telephone Encounter (Signed)
Call from patient and she sated she went to [facility] this morning and she felt like she was not taken seriously. She stated she was offered an MRI if she wanted but if she did not it was ok, she fel like her questions were not answered and she wanted to know your opinion. She has an apt with [facility] for a second opinion and she wanted to know do you think, do you think she should get the MRI and keep the apt for the second opinion. Patient can be reached at work tomorrow [phone] press zero to speak with patient. Please advise     KP

## 2011-02-23 ENCOUNTER — Ambulatory Visit: Payer: BC Managed Care – PPO | Admitting: Neurology

## 2011-02-24 NOTE — Telephone Encounter (Signed)
Not who she saw at [facility] but if she didn't feel like they heard what she was saying or taking her seriously I would get the 2nd opinion before proceeding w/ a very expensive test.

## 2011-02-24 NOTE — Telephone Encounter (Signed)
Left message on personally identified voicemail to notify pt of Dr. Rennis Golden message

## 2011-02-26 ENCOUNTER — Ambulatory Visit (INDEPENDENT_AMBULATORY_CARE_PROVIDER_SITE_OTHER): Payer: BC Managed Care – PPO | Admitting: Neurology

## 2011-02-26 ENCOUNTER — Ambulatory Visit: Payer: BC Managed Care – PPO | Admitting: Neurology

## 2011-02-26 ENCOUNTER — Encounter: Payer: Self-pay | Admitting: Neurology

## 2011-02-26 VITALS — BP 116/72 | HR 80 | Ht 64.0 in | Wt 121.0 lb

## 2011-02-26 DIAGNOSIS — R413 Other amnesia: Secondary | ICD-10-CM
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
## 2011-02-26 NOTE — Patient Instructions (Signed)
We will call you with your appointment for the memory testing at [facility] [address] [place], [place]  [phone]  

## 2011-04-13 ENCOUNTER — Telehealth: Payer: Self-pay | Admitting: Neurology

## 2011-04-13 NOTE — Telephone Encounter (Signed)
Questions and concerns about referral to memory testing

## 2011-04-14 NOTE — Telephone Encounter (Signed)
Left msg at [phone] for pt to call

## 2011-04-27 NOTE — Telephone Encounter (Signed)
Letter sent to pt by Misty Stanley

## 2011-05-12 ENCOUNTER — Encounter: Payer: Self-pay | Admitting: Internal Medicine

## 2011-05-12 ENCOUNTER — Ambulatory Visit (INDEPENDENT_AMBULATORY_CARE_PROVIDER_SITE_OTHER): Payer: BC Managed Care – PPO | Admitting: Internal Medicine

## 2011-05-12 VITALS — BP 106/70 | HR 126 | Temp 101.8°F | Wt 119.4 lb

## 2011-05-12 DIAGNOSIS — R509 Fever, unspecified: Secondary | ICD-10-CM

## 2011-05-12 LAB — POCT INFLUENZA A/B: Influenza A, POC: NEGATIVE

## 2011-05-12 MED ORDER — OSELTAMIVIR PHOSPHATE 75 MG PO CAPS: 75.0000 mg | ORAL_CAPSULE | Freq: Two times a day (BID) | ORAL | Status: AC

## 2011-05-12 NOTE — Patient Instructions (Addendum)
Zicam Melts or Zinc lozenges ; vitamin C 2000 mg daily; & Echinacea for 4-7 days. Report fever, exudate("pus") or progressive pain. Plain Mucinex for thick secretions ;force NON dairy fluids, up to 8 ounces every hour while awake . Use a Neti pot daily as needed for sinus congestion . Please report persistent or progressive symptoms over the next 24 hours. NSAIDS ( Aleve, Advil, Naproxen) or Tylenol every 4 hrs as needed for fever as discussed based on label recommendations

## 2011-05-12 NOTE — Progress Notes (Signed)
  Subjective:    Patient ID: Renee Patterson, female    DOB: [birth year], 25 y.o.   MRN: [id number]  HPI Respiratory tract infection Onset/symptoms:12/24 eve as chills , nasal congestion , temp to 99 Exposures (illness/environmental/extrinsic):friend @ work with [employer] several weeks ago Progression of symptoms:to headache, chills, dry cough Treatments/response:NSAIDS with fever response Present symptoms: Frontal headache:yes Facial pain:no Nasal purulence:no Sore throat:no Dental pain:no Lymphadenopathy:no Wheezing/shortness of breath:no Cough/sputum/hemoptysis:dry  Associated extrinsic/allergic symptoms:itchy eyes/ sneezing:some sneezing Past medical history: Seasonal allergies: no/asthma:no Smoking history:never           Review of Systems     Objective:   Physical Exam General appearance: thin but well nourished ;in  no acute distress or increased work of breathing is present but appearsfatigued.    Shotty cervical   lymphadenopathy ; none in  axilla    Eyes: No conjunctival inflammation or lid edema is present.   Ears:  External ear exam shows no significant lesions or deformities.  Otoscopic examination reveals clear canals, tympanic membranes are intact bilaterally without bulging, retraction, inflammation or discharge.  Nose:  External nasal examination shows no deformity or inflammation. Nasal mucosa are dry without lesions or exudates. No septal dislocation. Bilateral  obstruction to airflow.   Oral exam: Dental hygiene is good; lips and gums are healthy appearing.There is no oropharyngeal erythema or exudate noted.   Neck:   Supple with full range of motion without pain.   Heart:  Tachycardia; regular rhythm. Flow murmur w/o click, rub or other extra sounds.   Lungs:Chest clear to auscultation; no wheezes, rhonchi,rales ,or rubs present.No increased work of breathing.    Extremities:  No  Edema or clubbing  noted    Skin: Warm & dry w/o jaundice or tenting.            Assessment & Plan:   #1 febrile illness with nonproductive cough; clinically influenza suggested despite the negative swab. Clinically she is not dehydrated but does have a tachycardia.  Tamiflu will be prescribed. Symptomatic therapy will be pursued. CBC and differential will be indicated if she does not have rapid improvement.

## 2011-06-11 ENCOUNTER — Encounter: Payer: Self-pay | Admitting: Family Medicine

## 2011-06-11 ENCOUNTER — Telehealth: Payer: Self-pay | Admitting: *Deleted

## 2011-06-11 ENCOUNTER — Ambulatory Visit (INDEPENDENT_AMBULATORY_CARE_PROVIDER_SITE_OTHER): Payer: BC Managed Care – PPO | Admitting: Family Medicine

## 2011-06-11 VITALS — BP 109/70 | HR 95 | Temp 98.3°F | Ht 65.0 in | Wt 121.6 lb

## 2011-06-11 DIAGNOSIS — R071 Chest pain on breathing: Secondary | ICD-10-CM

## 2011-06-11 DIAGNOSIS — R0781 Pleurodynia: Secondary | ICD-10-CM | POA: Insufficient documentation

## 2011-06-11 LAB — D-DIMER, QUANTITATIVE: D-Dimer, Quant: 0.22 ug/mL-FEU (ref 0.00–0.48)

## 2011-06-11 MED ORDER — NAPROXEN 500 MG PO TABS: 500.0000 mg | ORAL_TABLET | Freq: Two times a day (BID) | ORAL | Status: DC

## 2011-06-11 NOTE — Progress Notes (Signed)
  Subjective:    Patient ID: Renee Patterson, female    DOB: 1985-11-12, 26 y.o.   MRN: [id number]  HPI Back pain- sxs started 1 week ago.  Thoracic.  Intermittent.  Occurs w/ inspiration and worsens as the day goes on.  Had flu over the holidays- took Tamiflu.  Still having nasal drainage, sneezing.  No cough.  No recent immobility.  Does heavy lifting- 2 boys.  No hx of similar.   Review of Systems For ROS see HPI     Objective:   Physical Exam  Vitals reviewed. Constitutional: She appears well-developed and well-nourished. No distress.  HENT:  Head: Normocephalic and atraumatic.  Neck: Normal range of motion. Neck supple.  Cardiovascular: Normal rate, regular rhythm, normal heart sounds and intact distal pulses.  Exam reveals no friction rub.   No murmur heard. Pulmonary/Chest: Effort normal and breath sounds normal. No respiratory distress. She has no wheezes. She has no rales. She exhibits no tenderness.  Musculoskeletal: She exhibits no edema.       Normal flexion and extension of spine, no TTP over thoracic vertebra.  Mild TTP over paraspinal muscles  Lymphadenopathy:    She has no cervical adenopathy.          Assessment & Plan:

## 2011-06-11 NOTE — Patient Instructions (Signed)
We'll notify you of your lab results and determine whether you need a CT scan Take the Naproxen twice daily until pain improves Use a heating pad for pain relief Start Claritin or Zyrtec daily for post nasal drip Hang in there!

## 2011-06-11 NOTE — Telephone Encounter (Signed)
Called pt to advise results from MD Tabori as to the following:  Pt's Ddimer is normal- this makes clot highly unlikely and she does not need CT scan Pt understood

## 2011-06-14 ENCOUNTER — Telehealth: Payer: Self-pay | Admitting: *Deleted

## 2011-06-14 NOTE — Telephone Encounter (Signed)
She should take this for 5-7 days regularly and then as needed for pain.  If symptoms have not improved by then she should call.

## 2011-06-14 NOTE — Telephone Encounter (Signed)
Discuss with patient  

## 2011-06-14 NOTE — Telephone Encounter (Signed)
Pt states that she just picked up naproxen today and would like to know how long she needs to give med to work and how long should back pain last with breathing..Please advise

## 2011-06-20 NOTE — Assessment & Plan Note (Signed)
Most likely musculoskeletal given pt's recent flu.  Given that it is pleuritic must r/o PE w/ ddimer.  Start naproxen, heating pad.  Reviewed supportive care and red flags that should prompt return.  Pt expressed understanding and is in agreement w/ plan.

## 2011-07-16 ENCOUNTER — Encounter: Payer: Self-pay | Admitting: Family Medicine

## 2011-07-16 ENCOUNTER — Ambulatory Visit (INDEPENDENT_AMBULATORY_CARE_PROVIDER_SITE_OTHER): Payer: BC Managed Care – PPO | Admitting: Family Medicine

## 2011-07-16 DIAGNOSIS — F341 Dysthymic disorder: Secondary | ICD-10-CM

## 2011-07-16 DIAGNOSIS — F329 Major depressive disorder, single episode, unspecified: Secondary | ICD-10-CM

## 2011-07-16 DIAGNOSIS — F32A Depression, unspecified: Secondary | ICD-10-CM

## 2011-07-16 DIAGNOSIS — F419 Anxiety disorder, unspecified: Secondary | ICD-10-CM

## 2011-07-16 DIAGNOSIS — F3289 Other specified depressive episodes: Secondary | ICD-10-CM

## 2011-07-16 MED ORDER — CITALOPRAM HYDROBROMIDE 20 MG PO TABS: 20.0000 mg | ORAL_TABLET | Freq: Every day | ORAL | Status: DC

## 2011-07-16 MED ORDER — ALPRAZOLAM 0.25 MG PO TABS: 0.2500 mg | ORAL_TABLET | ORAL | Status: DC | PRN

## 2011-07-16 NOTE — Patient Instructions (Signed)
Follow up in 6 weeks to recheck anxiety Use the xanax as needed for those panic moments Call with any questions or concerns Hang in there!!!

## 2011-07-16 NOTE — Assessment & Plan Note (Signed)
Deteriorated.  Pt recently had panic attack requiring call to EMS.  Pt having obsessive thoughts about someone breaking into her home.  Reports these sxs had improved when on meds.  Restart Celexa and xanax prn.  Will follow closely.

## 2011-07-16 NOTE — Progress Notes (Signed)
  Subjective:    Patient ID: Renee Patterson, female    DOB: 08-22-1985, 26 y.o.   MRN: [id number]  HPI Anxiety- had panic attack last week.  Had SOB, 'R hand got stuck'.  Husband was taking her to ER when 'my whole body got tingly and numb and i freaked out'.  EMS called- dx'd panic.  Previously on Celexa- stopped this.  + family hx of anxiety.     Review of Systems For ROS see HPI     Objective:   Physical Exam  Vitals reviewed. Constitutional: She appears well-developed and well-nourished. No distress.  Psychiatric: She has a normal mood and affect. Her behavior is normal. Judgment and thought content normal.          Assessment & Plan:

## 2011-09-20 ENCOUNTER — Ambulatory Visit (INDEPENDENT_AMBULATORY_CARE_PROVIDER_SITE_OTHER): Payer: BC Managed Care – PPO | Admitting: Family Medicine

## 2011-09-20 ENCOUNTER — Ambulatory Visit
Admission: RE | Admit: 2011-09-20 | Discharge: 2011-09-20 | Disposition: A | Discharge status: Home / Self Care | Payer: BC Managed Care – PPO | Source: Ambulatory Visit | Attending: Family Medicine | Admitting: Family Medicine

## 2011-09-20 ENCOUNTER — Encounter: Payer: Self-pay | Admitting: Family Medicine

## 2011-09-20 VITALS — BP 130/75 | HR 76 | Temp 98.8°F | Ht 64.75 in | Wt 121.6 lb

## 2011-09-20 DIAGNOSIS — N63 Unspecified lump in unspecified breast: Secondary | ICD-10-CM

## 2011-09-20 NOTE — Assessment & Plan Note (Signed)
New.  Mass is large, smooth, well defined.  Get US/mammo to assess for possible malignancy.  Will follow closely.

## 2011-09-20 NOTE — Progress Notes (Signed)
  Subjective:    Patient ID: Renee Patterson, female    DOB: [birth year], 26 y.o.   MRN: [id number]  HPI Breast lump- felt yesterday on routine exam, R breast.  Not painful.  Has hx of fibrocystic breasts but 'this is totally different'.  Maternal great grandmother w/ breast cancer.  Has IUD.   Review of Systems For ROS see HPI     Objective:   Physical Exam  Vitals reviewed. Constitutional: She appears well-developed and well-nourished. No distress.  Genitourinary:       R breast mass at 5 o'clock, 2 cm, firm, fully mobile          Assessment & Plan:

## 2011-09-20 NOTE — Patient Instructions (Signed)
Go for your mammo appt Try and relax! Hang in there!!

## 2011-10-05 ENCOUNTER — Encounter: Payer: Self-pay | Admitting: *Deleted

## 2011-10-18 ENCOUNTER — Other Ambulatory Visit: Payer: Self-pay | Admitting: Family Medicine

## 2011-10-18 ENCOUNTER — Telehealth: Payer: Self-pay | Admitting: Family Medicine

## 2011-10-18 DIAGNOSIS — D249 Benign neoplasm of unspecified breast: Secondary | ICD-10-CM

## 2011-10-18 NOTE — Telephone Encounter (Signed)
Patient stated she was referred about her abscess in her breast and the breast ctr did their testing and all was fine, but she feels it is getting larger and it is tender to the touch. Does she need to schedule appt. Here or go back to the breast ctr? Please advise & I can call her back to schedule here Patient # at work [phone]

## 2011-10-18 NOTE — Telephone Encounter (Signed)
Please advise 

## 2011-10-18 NOTE — Telephone Encounter (Signed)
Advised pt and she stated that she will call and make an apt with the breast center asap

## 2011-10-18 NOTE — Telephone Encounter (Signed)
If sxs are changing- pain, growth, etc- she will need to go back to breast center.

## 2011-10-19 ENCOUNTER — Inpatient Hospital Stay: Admission: RE | Admit: 2011-10-19 | Payer: BC Managed Care – PPO | Source: Ambulatory Visit

## 2011-12-16 ENCOUNTER — Ambulatory Visit (INDEPENDENT_AMBULATORY_CARE_PROVIDER_SITE_OTHER): Payer: BC Managed Care – PPO | Admitting: Neurology

## 2011-12-16 ENCOUNTER — Encounter: Payer: Self-pay | Admitting: Neurology

## 2011-12-16 VITALS — BP 106/70 | HR 64 | Wt 122.0 lb

## 2011-12-16 DIAGNOSIS — F419 Anxiety disorder, unspecified: Secondary | ICD-10-CM

## 2011-12-16 DIAGNOSIS — R413 Other amnesia: Secondary | ICD-10-CM

## 2011-12-16 DIAGNOSIS — F341 Dysthymic disorder: Secondary | ICD-10-CM
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
## 2012-01-28 ENCOUNTER — Ambulatory Visit: Payer: BC Managed Care – PPO | Admitting: Neurology

## 2012-01-31 ENCOUNTER — Ambulatory Visit (INDEPENDENT_AMBULATORY_CARE_PROVIDER_SITE_OTHER): Payer: BC Managed Care – PPO | Admitting: General Surgery

## 2012-01-31 ENCOUNTER — Encounter (INDEPENDENT_AMBULATORY_CARE_PROVIDER_SITE_OTHER): Payer: Self-pay | Admitting: General Surgery

## 2012-01-31 VITALS — BP 102/68 | HR 72 | Temp 97.7°F | Resp 16 | Ht 64.0 in | Wt 123.2 lb

## 2012-01-31 DIAGNOSIS — N63 Unspecified lump in unspecified breast: Secondary | ICD-10-CM

## 2012-01-31 NOTE — Progress Notes (Signed)
Subjective:     Patient ID: Renee Patterson, female   DOB: 1986/03/29, 26 y.o.   MRN: [id number]  HPI This is a 55 yof I know well from doing a cholecystectomy after the birth of her first child. She is done well from that. She comes back in to see me today for a right breast mass. This is been present for several months but it just came up and has been fairly large since she noticed it. She underwent an ultrasound that showed a 2.1 x 1 x 1.9 cm mass that appears very likely to be a fibroadenoma. She was given several options at that visit and decided on following this. Since then she's been seen by her gynecologist discuss with her having this excised. At this point since his large she would like to consider having it removed and that's why she is here to see me today. It does bother her been there. It does not really cause her any other symptoms. Is very mobile. She's also concerned that she may have another area on the left side that has been present for a couple months.  RIGHT BREAST ULTRASOUND  Comparison: None.  On physical exam, I palpate an oval 2 cm mass at 5 o'clock 3 cm  from the right nipple.  Findings: Ultrasound is performed, showing an oval homogeneous  horizontally oriented well-defined solid mass at 5 o'clock 3 cm in  the right nipple measuring 2.1 x 1.0 x 1.9 cm. This is very likely  a fibroadenoma. Options for management discussed with the patient  include short interval follow-up ultrasound (6, 12 and 24 months),  ultrasound-guided core needle biopsy or surgical excisional biopsy.  I suggested short interval follow-up ultrasound. The patient will  consider these options.  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  Suggest short interval follow-up ultrasound (6, 12 and 24 months).  Review of Systems  Constitutional: Negative for fever, chills and unexpected weight change.  HENT: Negative for hearing loss, congestion, sore throat, trouble  swallowing and voice change.   Eyes: Negative for visual disturbance.  Respiratory: Negative for cough and wheezing.   Cardiovascular: Negative for chest pain, palpitations and leg swelling.  Gastrointestinal: Negative for nausea, vomiting, abdominal pain, diarrhea, constipation, blood in stool, abdominal distention and anal bleeding.  Genitourinary: Negative for hematuria, vaginal bleeding and difficulty urinating.  Musculoskeletal: Negative for arthralgias.  Skin: Negative for rash and wound.  Neurological: Negative for seizures, syncope and headaches.  Hematological: Negative for adenopathy. Does not bruise/bleed easily.  Psychiatric/Behavioral: Negative for confusion.       Objective:   Physical Exam  Vitals reviewed. Constitutional: She appears well-developed and well-nourished.  Cardiovascular: Normal rate, regular rhythm and normal heart sounds.   Pulmonary/Chest: Effort normal and breath sounds normal. She has no wheezes. She has no rales. Right breast exhibits mass. Right breast exhibits no inverted nipple, no nipple discharge, no skin change and no tenderness. Left breast exhibits no inverted nipple, no mass, no nipple discharge, no skin change and no tenderness. Breasts are symmetrical.    Lymphadenopathy:    She has no cervical adenopathy.       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 I think the left side is  just as fibrocystic changes without any other abnormalities. I think  it is reasonable to excise this area as I don't think this is going to go away. We discussed a right breast mass excisional biopsy with the risks and benefits associated with that. We'll get her scheduled soon to do that.

## 2012-02-01 ENCOUNTER — Encounter (HOSPITAL_BASED_OUTPATIENT_CLINIC_OR_DEPARTMENT_OTHER): Payer: Self-pay | Admitting: *Deleted

## 2012-02-04 ENCOUNTER — Ambulatory Visit (HOSPITAL_BASED_OUTPATIENT_CLINIC_OR_DEPARTMENT_OTHER)
Admission: RE | Admit: 2012-02-04 | Discharge: 2012-02-04 | Disposition: A | Discharge status: Home / Self Care | Payer: BC Managed Care – PPO | Source: Ambulatory Visit | Attending: General Surgery | Admitting: General Surgery

## 2012-02-04 ENCOUNTER — Encounter (HOSPITAL_BASED_OUTPATIENT_CLINIC_OR_DEPARTMENT_OTHER): Payer: Self-pay | Admitting: Certified Registered"

## 2012-02-04 ENCOUNTER — Encounter (HOSPITAL_BASED_OUTPATIENT_CLINIC_OR_DEPARTMENT_OTHER): Payer: Self-pay | Admitting: *Deleted

## 2012-02-04 ENCOUNTER — Encounter (HOSPITAL_BASED_OUTPATIENT_CLINIC_OR_DEPARTMENT_OTHER): Payer: Self-pay | Admitting: Anesthesiology

## 2012-02-04 ENCOUNTER — Encounter (HOSPITAL_BASED_OUTPATIENT_CLINIC_OR_DEPARTMENT_OTHER)
Admission: RE | Disposition: A | Discharge status: Home / Self Care | Payer: Self-pay | Source: Ambulatory Visit | Attending: General Surgery

## 2012-02-04 ENCOUNTER — Ambulatory Visit (HOSPITAL_BASED_OUTPATIENT_CLINIC_OR_DEPARTMENT_OTHER): Payer: BC Managed Care – PPO | Admitting: Anesthesiology

## 2012-02-04 DIAGNOSIS — D249 Benign neoplasm of unspecified breast: Secondary | ICD-10-CM

## 2012-02-04 HISTORY — PX: MASS EXCISION: SHX2000

## 2012-02-04 HISTORY — DX: Other specified postprocedural states: Z98.890

## 2012-02-04 HISTORY — DX: Nausea with vomiting, unspecified: R11.2

## 2012-02-04 SURGERY — EXCISION MASS
Anesthesia: General | Site: Breast | Laterality: Right | Wound class: Clean

## 2012-02-04 MED ORDER — MEPERIDINE HCL 25 MG/ML IJ SOLN: 6.2500 mg | INTRAMUSCULAR | Status: DC | PRN

## 2012-02-04 MED ORDER — BUPIVACAINE HCL (PF) 0.25 % IJ SOLN
INTRAMUSCULAR | Status: DC | PRN
  Administered 2012-02-04: 10 mL

## 2012-02-04 MED ORDER — LACTATED RINGERS IV SOLN
INTRAVENOUS | Status: DC
  Administered 2012-02-04 (×2): via INTRAVENOUS

## 2012-02-04 MED ORDER — OXYCODONE HCL 5 MG/5ML PO SOLN: 5.0000 mg | Freq: Once | ORAL | Status: DC | PRN

## 2012-02-04 MED ORDER — OXYCODONE-ACETAMINOPHEN 5-325 MG PO TABS: 1.0000 | ORAL_TABLET | ORAL | Status: DC | PRN

## 2012-02-04 MED ORDER — OXYCODONE HCL 5 MG PO TABS: 5.0000 mg | ORAL_TABLET | Freq: Once | ORAL | Status: DC | PRN

## 2012-02-04 MED ORDER — FENTANYL CITRATE 0.05 MG/ML IJ SOLN
INTRAMUSCULAR | Status: DC | PRN
  Administered 2012-02-04: 100 ug via INTRAVENOUS

## 2012-02-04 MED ORDER — HYDROMORPHONE HCL PF 1 MG/ML IJ SOLN
0.2500 mg | INTRAMUSCULAR | Status: DC | PRN
  Administered 2012-02-04 (×3): 0.25 mg via INTRAVENOUS

## 2012-02-04 MED ORDER — LIDOCAINE HCL (CARDIAC) 20 MG/ML IV SOLN
INTRAVENOUS | Status: DC | PRN
  Administered 2012-02-04: 80 mg via INTRAVENOUS

## 2012-02-04 MED ORDER — PROPOFOL 10 MG/ML IV BOLUS
INTRAVENOUS | Status: DC | PRN
  Administered 2012-02-04: 160 mg via INTRAVENOUS

## 2012-02-04 MED ORDER — DEXAMETHASONE SODIUM PHOSPHATE 4 MG/ML IJ SOLN
INTRAMUSCULAR | Status: DC | PRN
  Administered 2012-02-04: 8 mg via INTRAVENOUS

## 2012-02-04 MED ORDER — PROMETHAZINE HCL 25 MG/ML IJ SOLN: 6.2500 mg | INTRAMUSCULAR | Status: DC | PRN

## 2012-02-04 MED ORDER — SCOPOLAMINE 1 MG/3DAYS TD PT72
1.0000 | MEDICATED_PATCH | Freq: Once | TRANSDERMAL | Status: DC
  Administered 2012-02-04: 1.5 mg via TRANSDERMAL

## 2012-02-04 MED ORDER — MIDAZOLAM HCL 5 MG/5ML IJ SOLN
INTRAMUSCULAR | Status: DC | PRN
  Administered 2012-02-04: 2 mg via INTRAVENOUS

## 2012-02-04 SURGICAL SUPPLY — 46 items
BLADE SURG 15 STRL LF DISP TIS (BLADE) ×1 IMPLANT
BLADE SURG 15 STRL SS (BLADE) ×1
BLADE SURG ROTATE 9660 (MISCELLANEOUS) IMPLANT
CANISTER SUCTION 1200CC (MISCELLANEOUS) IMPLANT
CHLORAPREP W/TINT 26ML (MISCELLANEOUS) ×2 IMPLANT
CLOTH BEACON ORANGE TIMEOUT ST (SAFETY) ×2 IMPLANT
COVER MAYO STAND STRL (DRAPES) ×2 IMPLANT
COVER TABLE BACK 60X90 (DRAPES) ×2 IMPLANT
DECANTER SPIKE VIAL GLASS SM (MISCELLANEOUS) IMPLANT
DERMABOND ADVANCED (GAUZE/BANDAGES/DRESSINGS) ×1
DERMABOND ADVANCED .7 DNX12 (GAUZE/BANDAGES/DRESSINGS) ×1 IMPLANT
DRAPE PED LAPAROTOMY (DRAPES) ×2 IMPLANT
DRSG TEGADERM 4X4.75 (GAUZE/BANDAGES/DRESSINGS) IMPLANT
ELECT COATED BLADE 2.86 ST (ELECTRODE) ×2 IMPLANT
ELECT REM PT RETURN 9FT ADLT (ELECTROSURGICAL) ×2
ELECTRODE REM PT RTRN 9FT ADLT (ELECTROSURGICAL) ×1 IMPLANT
GAUZE PACKING IODOFORM 1/4X5 (PACKING) IMPLANT
GAUZE SPONGE 4X4 12PLY STRL LF (GAUZE/BANDAGES/DRESSINGS) IMPLANT
GLOVE BIO SURGEON STRL SZ7 (GLOVE) ×4 IMPLANT
GLOVE BIOGEL PI IND STRL 7.5 (GLOVE) ×1 IMPLANT
GLOVE BIOGEL PI INDICATOR 7.5 (GLOVE) ×1
GOWN PREVENTION PLUS XLARGE (GOWN DISPOSABLE) ×4 IMPLANT
NEEDLE HYPO 25X1 1.5 SAFETY (NEEDLE) ×2 IMPLANT
NS IRRIG 1000ML POUR BTL (IV SOLUTION) IMPLANT
PACK BASIN DAY SURGERY FS (CUSTOM PROCEDURE TRAY) ×2 IMPLANT
PENCIL BUTTON HOLSTER BLD 10FT (ELECTRODE) ×2 IMPLANT
STRIP CLOSURE SKIN 1/2X4 (GAUZE/BANDAGES/DRESSINGS) ×2 IMPLANT
SUT ETHILON 2 0 FS 18 (SUTURE) IMPLANT
SUT MNCRL AB 4-0 PS2 18 (SUTURE) IMPLANT
SUT MON AB 5-0 PS2 18 (SUTURE) ×2 IMPLANT
SUT SILK 2 0 SH (SUTURE) IMPLANT
SUT VIC AB 2-0 SH 27 (SUTURE)
SUT VIC AB 2-0 SH 27XBRD (SUTURE) IMPLANT
SUT VIC AB 3-0 SH 27 (SUTURE) ×1
SUT VIC AB 3-0 SH 27X BRD (SUTURE) ×1 IMPLANT
SUT VICRYL 3-0 CR8 SH (SUTURE) IMPLANT
SUT VICRYL 4-0 PS2 18IN ABS (SUTURE) IMPLANT
SWAB CULTURE LIQ STUART DBL (MISCELLANEOUS) IMPLANT
SYR CONTROL 10ML LL (SYRINGE) ×2 IMPLANT
TOWEL OR 17X24 6PK STRL BLUE (TOWEL DISPOSABLE) ×2 IMPLANT
TOWEL OR NON WOVEN STRL DISP B (DISPOSABLE) IMPLANT
TUBE ANAEROBIC SPECIMEN COL (MISCELLANEOUS) IMPLANT
TUBE CONNECTING 20X1/4 (TUBING) IMPLANT
UNDERPAD 30X30 INCONTINENT (UNDERPADS AND DIAPERS) IMPLANT
WATER STERILE IRR 1000ML POUR (IV SOLUTION) IMPLANT
YANKAUER SUCT BULB TIP NO VENT (SUCTIONS) IMPLANT

## 2012-02-04 NOTE — Op Note (Signed)
Preoperative diagnosis: Right breast mass,likely fibroadenoma Postoperative diagnosis: same as above Procedure: Excision of right breast mass Surgeon: Dr. Harden Mo Estimated blood loss: Minimal Anesthesia: Gen. With LMA Specimens: Right breast mass to pathology Drains: None Complications: None Sponge and needle count was correct x2 at end of operation Disposition to recovery in stable condition  Indications: This a 26 year old female with a new onset of a right lower inner quadrant breast mass. This appears to be a fibroadenoma on ultrasound. It is about 2 x 2 centimeters in size. She considered just watching this but then she requested an appointment to be evaluated for excision. We discussed all of her options we decided on excision.  Procedure: After informed consent was obtained the patient was taken to the operating room. She had sequential compression devices on her legs. She was then placed under general anesthesia without complication. Her right breast was prepped and draped in the standard sterile surgical fashion. A surgical timeout was performed.  I made a periareolar incision in the region of the fibroadenoma. This was very mobile. I eventually was able to identify the fibroadenoma excised in its entirety. Clinically this appeared to be a fibroadenoma as well. I then obtained hemostasis. I then closed the breast tissue with 3-0 Vicryl. The dermis closed with 3-0 Vicryl and the skin was closed with 5-0 Monocryl. I infiltrated 10 cc of Marcaine throughout the region. I then placed Dermabond and Steri-Strips. She tolerated this well and was transferred to recovery stable.

## 2012-02-04 NOTE — Anesthesia Preprocedure Evaluation (Signed)
Anesthesia Evaluation  Patient identified by MRN, date of birth, ID band Patient awake    Reviewed: Allergy & Precautions, H&P , NPO status , Patient's Chart, lab work & pertinent test results  History of Anesthesia Complications Negative for: history of anesthetic complications  Airway Mallampati: I  Neck ROM: full    Dental No notable dental hx.    Pulmonary neg pulmonary ROS,  breath sounds clear to auscultation  Pulmonary exam normal       Cardiovascular negative cardio ROS  IRhythm:irregular     Neuro/Psych  Headaches, PSYCHIATRIC DISORDERS negative neurological ROS  negative psych ROS   GI/Hepatic negative GI ROS, Neg liver ROS,   Endo/Other  negative endocrine ROS  Renal/GU negative Renal ROS  negative genitourinary   Musculoskeletal   Abdominal   Peds  Hematology negative hematology ROS (+)   Anesthesia Other Findings   Reproductive/Obstetrics negative OB ROS                           Anesthesia Physical Anesthesia Plan  ASA: I  Anesthesia Plan: General and General LMA   Post-op Pain Management:    Induction:   Airway Management Planned:   Additional Equipment:   Intra-op Plan:   Post-operative Plan:   Informed Consent: I have reviewed the patients History and Physical, chart, labs and discussed the procedure including the risks, benefits and alternatives for the proposed anesthesia with the patient or authorized representative who has indicated his/her understanding and acceptance.   Dental Advisory Given  Plan Discussed with: CRNA, Surgeon and Anesthesiologist  Anesthesia Plan Comments:         Anesthesia Quick Evaluation

## 2012-02-04 NOTE — H&P (View-Only) (Signed)
Subjective:     Patient ID: Renee Patterson, female   DOB: 09/15/1985, 26 y.o.   MRN: [id number]  HPI This is a 26 yof I know well from doing a cholecystectomy after the birth of her first child. She is done well from that. She comes back in to see me today for a right breast mass. This is been present for several months but it just came up and has been fairly large since she noticed it. She underwent an ultrasound that showed a 2.1 x 1 x 1.9 cm mass that appears very likely to be a fibroadenoma. She was given several options at that visit and decided on following this. Since then she's been seen by her gynecologist discuss with her having this excised. At this point since his large she would like to consider having it removed and that's why she is here to see me today. It does bother her been there. It does not really cause her any other symptoms. Is very mobile. She's also concerned that she may have another area on the left side that has been present for a couple months.  RIGHT BREAST ULTRASOUND  Comparison: None.  On physical exam, I palpate an oval 2 cm mass at 5 o'clock 3 cm  from the right nipple.  Findings: Ultrasound is performed, showing an oval homogeneous  horizontally oriented well-defined solid mass at 5 o'clock 3 cm in  the right nipple measuring 2.1 x 1.0 x 1.9 cm. This is very likely  a fibroadenoma. Options for management discussed with the patient  include short interval follow-up ultrasound (6, 12 and 24 months),  ultrasound-guided core needle biopsy or surgical excisional biopsy.  I suggested short interval follow-up ultrasound. The patient will  consider these options.  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  Suggest short interval follow-up ultrasound (6, 12 and 24 months).  Review of Systems  Constitutional: Negative for fever, chills and unexpected weight change.  HENT: Negative for hearing loss, congestion, sore throat, trouble  swallowing and voice change.   Eyes: Negative for visual disturbance.  Respiratory: Negative for cough and wheezing.   Cardiovascular: Negative for chest pain, palpitations and leg swelling.  Gastrointestinal: Negative for nausea, vomiting, abdominal pain, diarrhea, constipation, blood in stool, abdominal distention and anal bleeding.  Genitourinary: Negative for hematuria, vaginal bleeding and difficulty urinating.  Musculoskeletal: Negative for arthralgias.  Skin: Negative for rash and wound.  Neurological: Negative for seizures, syncope and headaches.  Hematological: Negative for adenopathy. Does not bruise/bleed easily.  Psychiatric/Behavioral: Negative for confusion.       Objective:   Physical Exam  Vitals reviewed. Constitutional: She appears well-developed and well-nourished.  Cardiovascular: Normal rate, regular rhythm and normal heart sounds.   Pulmonary/Chest: Effort normal and breath sounds normal. She has no wheezes. She has no rales. Right breast exhibits mass. Right breast exhibits no inverted nipple, no nipple discharge, no skin change and no tenderness. Left breast exhibits no inverted nipple, no mass, no nipple discharge, no skin change and no tenderness. Breasts are symmetrical.    Lymphadenopathy:    She has no cervical adenopathy.       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 I think the left side is  just as fibrocystic changes without any other abnormalities. I think   it is reasonable to excise this area as I don't think this is going to go away. We discussed a right breast mass excisional biopsy with the risks and benefits associated with that. We'll get her scheduled soon to do that.      

## 2012-02-04 NOTE — Interval H&P Note (Signed)
History and Physical Interval Note:  02/04/2012 7:16 AM  Renee Patterson  has presented today for surgery, with the diagnosis of right breast mass  The various methods of treatment have been discussed with the patient and family. After consideration of risks, benefits and other options for treatment, the patient has consented to  Procedure(s) (LRB) with comments: EXCISION MASS (Right) - right breast mass excision as a surgical intervention .  The patient's history has been reviewed, patient examined, no change in status, stable for surgery.  I have reviewed the patient's chart and labs.  Questions were answered to the patient's satisfaction.     Hanford Lust

## 2012-02-04 NOTE — Anesthesia Procedure Notes (Signed)
Procedure Name: LMA Insertion Date/Time: 02/04/2012 7:35 AM Performed by: Verlan Friends Pre-anesthesia Checklist: Patient identified, Emergency Drugs available, Suction available, Patient being monitored and Timeout performed Patient Re-evaluated:Patient Re-evaluated prior to inductionOxygen Delivery Method: Circle System Utilized Preoxygenation: Pre-oxygenation with 100% oxygen Intubation Type: IV induction Ventilation: Mask ventilation without difficulty LMA: LMA inserted LMA Size: 3.0 Number of attempts: 1 Placement Confirmation: positive ETCO2 Tube secured with: Tape Dental Injury: Teeth and Oropharynx as per pre-operative assessment

## 2012-02-04 NOTE — Transfer of Care (Signed)
Immediate Anesthesia Transfer of Care Note  Patient: Renee Patterson  Procedure(s) Performed: Procedure(s) (LRB) with comments: EXCISION MASS (Right) - right breast mass excision  Patient Location: PACU  Anesthesia Type: General  Level of Consciousness: sedated and unresponsive  Airway & Oxygen Therapy: Patient Spontanous Breathing and Patient connected to face mask oxygen  Post-op Assessment: Report given to PACU RN and Post -op Vital signs reviewed and stable  Post vital signs: Reviewed and stable  Complications: No apparent anesthesia complications

## 2012-02-04 NOTE — Anesthesia Postprocedure Evaluation (Signed)
  Anesthesia Post-op Note  Patient: Renee Patterson  Procedure(s) Performed: Procedure(s) (LRB) with comments: EXCISION MASS (Right) - right breast mass excision  Patient Location: PACU  Anesthesia Type: General  Level of Consciousness: awake  Airway and Oxygen Therapy: Patient Spontanous Breathing  Post-op Pain: mild  Post-op Assessment: Post-op Vital signs reviewed  Post-op Vital Signs: stable  Complications: No apparent anesthesia complications

## 2012-02-08 ENCOUNTER — Telehealth (INDEPENDENT_AMBULATORY_CARE_PROVIDER_SITE_OTHER): Payer: Self-pay

## 2012-02-08 ENCOUNTER — Encounter (HOSPITAL_BASED_OUTPATIENT_CLINIC_OR_DEPARTMENT_OTHER): Payer: Self-pay | Admitting: General Surgery

## 2012-02-08 NOTE — Telephone Encounter (Signed)
Pt returned the call and was informed of the path report.

## 2012-02-08 NOTE — Telephone Encounter (Signed)
LMOM for pt to call me so I can give her the good news on the path report being benign with a path report of a fibroadenoma no evidence of malignancy per Dr Dwain Sarna.

## 2012-02-17 ENCOUNTER — Ambulatory Visit: Payer: BC Managed Care – PPO | Admitting: Family Medicine

## 2012-02-18 ENCOUNTER — Ambulatory Visit: Payer: BC Managed Care – PPO | Admitting: Family Medicine

## 2012-02-25 ENCOUNTER — Encounter (INDEPENDENT_AMBULATORY_CARE_PROVIDER_SITE_OTHER): Payer: Self-pay | Admitting: General Surgery

## 2012-02-25 ENCOUNTER — Ambulatory Visit (INDEPENDENT_AMBULATORY_CARE_PROVIDER_SITE_OTHER): Payer: BC Managed Care – PPO | Admitting: General Surgery

## 2012-02-25 VITALS — BP 118/62 | HR 68 | Temp 98.0°F | Resp 18 | Ht 64.0 in | Wt <= 1120 oz

## 2012-02-25 DIAGNOSIS — Z09 Encounter for follow-up examination after completed treatment for conditions other than malignant neoplasm: Secondary | ICD-10-CM

## 2012-02-25 NOTE — Progress Notes (Signed)
Subjective:     Patient ID: Renee Patterson, female   DOB: 09-18-1985, 26 y.o.   MRN: [id number]  HPI This is a 26 year old healthy female who underwent an excision of a right breast mass recently. She is doing well from this without complaints. She has had bronchitis for which he is currently undergoing treatment now. Her pathology shows that this is a fibroadenoma.  Review of Systems     Objective:   Physical Exam Right breast incision healing well without infection    Assessment:     Status post excision of right breast fibroadenoma    Plan:     She is healing well. Her Steri-Strips will fall off soon. I told her she could begin massaging the incision when that happens. She is going to return to normal activity will see me as needed.

## 2012-02-25 NOTE — Patient Instructions (Signed)
Breast Self-Awareness  Practicing breast self-awareness may pick up problems early, prevent significant medical complications, and possibly save your life. By practicing breast self-awareness, you can become familiar with how your breasts look and feel and if your breasts are changing. This allows you to notice changes early. It can also offer you some reassurance that your breast health is good. One way to learn what is normal for your breasts and whether your breasts are changing is to do a breast self-exam.  If you find a lump or something that was not present in the past, it is best to contact your caregiver right away. Other findings that should be evaluated by your caregiver include nipple discharge, especially if it is bloody; skin changes or reddening; areas where the skin seems to be pulled in (retracted); or new lumps and bumps. Breast pain is seldom associated with cancer (malignancy), but should also be evaluated by a caregiver.  BREAST SELF-EXAM  The best time to examine your breasts is 5 7 days after your menstrual period is over. During menstruation, the breasts are lumpier, and it may be more difficult to pick up changes. If you do not menstruate, have reached menopause, or had your uterus removed (hysterectomy), you should examine your breasts at regular intervals, such as monthly. If you are breastfeeding, examine your breasts after a feeding or after using a breast pump. Breast implants do not decrease the risk for lumps or tumors, so continue to perform breast self-exams as recommended. Talk to your caregiver about how to determine the difference between the implant and breast tissue. Also, talk about the amount of pressure you should use during the exam. Over time, you will become more familiar with the variations of your breasts and more comfortable with the exam. A breast self-exam requires you to remove all your clothes above the waist.    Look at your breasts and nipples. Stand in front of  a mirror in a room with good lighting. With your hands on your hips, push your hands firmly downward. Look for a difference in shape, contour, and size from one breast to the other (asymmetry). Asymmetry includes puckers, dips, or bumps. Also, look for skin changes, such as reddened or scaly areas on the breasts. Look for nipple changes, such as discharge, dimpling, repositioning, or redness.   Carefully feel your breasts. This is best done either in the shower or tub while using soapy water or when flat on your back. Place the arm (on the side of the breast you are examining) above your head. Use the pads (not the fingertips) of your three middle fingers on your opposite hand to feel your breasts. Start in the underarm area and use  inch (2 cm) overlapping circles to feel your breast. Use 3 different levels of pressure (light, medium, and firm pressure) at each circle before moving to the next circle. The light pressure is needed to feel the tissue closest to the skin. The medium pressure will help to feel breast tissue a little deeper, while the firm pressure is needed to feel the tissue close to the ribs. Continue the overlapping circles, moving downward over the breast until you feel your ribs below your breast. Then, move one finger-width towards the center of the body. Continue to use the  inch (2 cm) overlapping circles to feel your breast as you move slowly up toward the collar bone (clavicle) near the base of the neck. Continue the up and down exam using all 3 pressures   until you reach the middle of the chest. Do this with each breast, carefully feeling for lumps or changes.   Keep a written record with breast changes or normal findings for each breast. By writing this information down, you do not need to depend only on memory for size, tenderness, or location. Write down where you are in your menstrual cycle, if you are still menstruating.   Breast tissue can have some lumps or thick tissue. However,  see your caregiver if you find anything that concerns you.   SEEK MEDICAL CARE IF:   You see a change in shape, contour, or size of your breasts or nipples.    You see skin changes, such as reddened or scaly areas on the breasts or nipples.    You have an unusual discharge from your nipples.    You feel a new lump or unusually thick areas.   Document Released: 05/03/2005 Document Revised: 11/02/2011 Document Reviewed: 08/18/2011  [employer] Patient Information 2013 [employer].

## 2012-07-13 ENCOUNTER — Telehealth: Payer: Self-pay | Admitting: Family Medicine

## 2012-07-13 NOTE — Telephone Encounter (Signed)
Patient Information:  Caller Name: Margrete  Phone: [phone]  Patient: Renee Patterson  Gender: Female  DOB: 30-Apr-1986  Age: 27 Years  PCP: Sheliah Hatch  Pregnant: No  Office Follow Up:  Does the office need to follow up with this patient?: Yes  Instructions For The Office: she is requesting medication details in note  RN Note:  She said about a year ago she was given a Rx for Celexa but she never started taking it.  She said she was given some Valium to take for acute anxiety type problems  She has noticed that over past few months there are situational episodes that provoke almost like a panic attack.  She said she has not done things with family due to this ex. they had gone [place]  She said she got to thinking what would happen if she got stuck in there, she then had mild panic attack and wouldn't go in the [place] wiht her family.  She can't think of anything that has triggered this.  She is eating and sleeping well and she is functioning well with her daily activities.  She feels she may need to try something on daily basis.  She lost the Rx for her Celexa  Symptoms  Reason For Call & Symptoms: anxiety x 3months  Reviewed Health History In EMR: Yes  Reviewed Medications In EMR: Yes  Reviewed Allergies In EMR: Yes  Reviewed Surgeries / Procedures: Yes  Date of Onset of Symptoms: 04/18/2012 OB / GYN:  LMP: Unknown  Guideline(s) Used:  No Protocol Available - Sick Adult  Disposition Per Guideline:   Discuss with PCP and Callback by Nurse Today  Reason For Disposition Reached:   Nursing judgment  Advice Given:  Call Back If:  New symptoms develop  You become worse.

## 2012-07-13 NOTE — Telephone Encounter (Signed)
Ok for Celexa 20mg  daily.  #30, 3 refills.  Needs to f/u in 3-4 weeks to see if sxs are improving.

## 2012-07-14 MED ORDER — CITALOPRAM HYDROBROMIDE 20 MG PO TABS: 20.0000 mg | ORAL_TABLET | Freq: Every day | ORAL | Status: DC

## 2012-07-14 NOTE — Telephone Encounter (Signed)
Spoke with the pt and informed her that Dr. Tawni Carnes the Celexa 20mg  daily.  Also she will need to f/u in 3-4 weeks to see if the sxs have improved.  Pt ageed.  New rx was sent to the pharmacy([facility]) by e-script.//AB/CMA

## 2012-12-01 ENCOUNTER — Other Ambulatory Visit: Payer: Self-pay | Admitting: Family Medicine

## 2012-12-05 NOTE — Telephone Encounter (Signed)
Last refill:10-28-12 Last OV:09-20-11 Please advise.//AB/CMA

## 2012-12-06 NOTE — Telephone Encounter (Signed)
Please remind pt to schedule her yearly CPE 

## 2013-02-14 LAB — HM PAP SMEAR: HM PAP: NORMAL

## 2013-02-23 ENCOUNTER — Ambulatory Visit: Payer: BC Managed Care – PPO | Admitting: Family Medicine

## 2013-05-14 ENCOUNTER — Other Ambulatory Visit: Payer: Self-pay | Admitting: Family Medicine

## 2013-05-14 NOTE — Telephone Encounter (Signed)
Med filled only #30. Pt needs an OV for any refills.

## 2013-06-13 ENCOUNTER — Ambulatory Visit: Payer: BC Managed Care – PPO | Admitting: Family Medicine

## 2013-08-14 ENCOUNTER — Other Ambulatory Visit: Payer: Self-pay | Admitting: Family Medicine

## 2013-08-14 NOTE — Telephone Encounter (Signed)
Letter mailed to pt. Cannot fill again without an appt. Has not been seen since 2013.

## 2013-09-21 ENCOUNTER — Other Ambulatory Visit: Payer: Self-pay | Admitting: Family Medicine

## 2013-09-24 NOTE — Telephone Encounter (Signed)
Letter mailed to pt in March to schedule a follow up appt. Cannot fill until then since pt has not been seen since 2013.

## 2013-09-27 ENCOUNTER — Telehealth: Payer: Self-pay | Admitting: Family Medicine

## 2013-09-27 MED ORDER — CITALOPRAM HYDROBROMIDE 20 MG PO TABS: ORAL_TABLET | ORAL | Status: DC

## 2013-09-27 NOTE — Telephone Encounter (Signed)
Med filled for one month. Pt can schedule as soon as in town.

## 2013-09-27 NOTE — Telephone Encounter (Signed)
Caller name: Cozy  Relation to pt: Call back number: [phone] Pharmacy: [facility] [address]   Reason for call: pt is needing a refill on RX citalopram (CELEXA) 20 MG tablet. Pt was advised that she needed an apt to get the meds refilled, but pt states she is going out of town and just needs like 4 or 5 until she can get in for a med follow up.  Please advise.

## 2013-10-24 ENCOUNTER — Ambulatory Visit (INDEPENDENT_AMBULATORY_CARE_PROVIDER_SITE_OTHER): Payer: BC Managed Care – PPO | Admitting: Family Medicine

## 2013-10-24 ENCOUNTER — Encounter: Payer: Self-pay | Admitting: Family Medicine

## 2013-10-24 VITALS — BP 118/80 | HR 68 | Temp 99.0°F | Resp 16 | Wt 130.0 lb

## 2013-10-24 DIAGNOSIS — F329 Major depressive disorder, single episode, unspecified: Secondary | ICD-10-CM

## 2013-10-24 DIAGNOSIS — F32A Depression, unspecified: Secondary | ICD-10-CM

## 2013-10-24 DIAGNOSIS — F341 Dysthymic disorder: Secondary | ICD-10-CM

## 2013-10-24 DIAGNOSIS — F419 Anxiety disorder, unspecified: Secondary | ICD-10-CM

## 2013-10-24 MED ORDER — ALPRAZOLAM 0.25 MG PO TABS: 0.2500 mg | ORAL_TABLET | ORAL | Status: DC | PRN

## 2013-10-24 MED ORDER — CITALOPRAM HYDROBROMIDE 40 MG PO TABS: 40.0000 mg | ORAL_TABLET | Freq: Every day | ORAL | Status: DC

## 2013-10-24 NOTE — Assessment & Plan Note (Signed)
Deteriorated.  Pt now having panic attacks when in a confined space.  Will increase daily SSRI.  Restart low dose xanax prn.  Strongly encouraged pt to start counseling.  Will follow.

## 2013-10-24 NOTE — Patient Instructions (Signed)
Please schedule your complete physical at your convenience Increase the Celexa to 40mg - 2 tabs of what you have at home and 1 of the new prescription Use the xanax as needed for those panicked moments Please consider starting counseling to address the cause of your fears Call with any questions or concerns Hang in there!!!

## 2013-10-24 NOTE — Progress Notes (Signed)
   Subjective:    Patient ID: Renee Patterson, female    DOB: September 07, 1985, 28 y.o.   MRN: [id number]  HPI Depression/anxiety- chronic problem, on Celexa.  Pt reports 'it's working pretty good'.  Pt having increased anxiety w/ closed spaces- to the point 'i'm panicking'.  Afraid of missing out on things w/ her kids b/c of her new fears.  Has upcoming trip to [place]- fears she won't be able to enjoy.   Review of Systems For ROS see HPI     Objective:   Physical Exam  Vitals reviewed. Constitutional: She is oriented to person, place, and time. She appears well-developed and well-nourished. No distress.  HENT:  Head: Normocephalic and atraumatic.  Neurological: She is alert and oriented to person, place, and time.  Skin: Skin is warm and dry.  Psychiatric: She has a normal mood and affect. Her behavior is normal. Thought content normal.          Assessment & Plan:

## 2013-10-24 NOTE — Progress Notes (Signed)
Pre visit review using our clinic review tool, if applicable. No additional management support is needed unless otherwise documented below in the visit note. 

## 2013-12-17 ENCOUNTER — Telehealth: Payer: Self-pay | Admitting: Family Medicine

## 2013-12-17 NOTE — Telephone Encounter (Signed)
Caller name: Murrel Relation to pt: Call back number: [phone] Pharmacy: [place], [place] [phone]   Reason for call:   Pt is at [place] [facility] and is needing her rx for Xanax sent to this pharmacy.  Pt never got it filled and is needing it now, bad. Please advise.

## 2013-12-17 NOTE — Telephone Encounter (Signed)
Last OV 10-24-13 Xanax last filled #30 with 1

## 2013-12-19 MED ORDER — ALPRAZOLAM 0.25 MG PO TABS: 0.2500 mg | ORAL_TABLET | ORAL | Status: DC | PRN

## 2013-12-19 NOTE — Telephone Encounter (Signed)
Med filled and faxed.  

## 2013-12-19 NOTE — Telephone Encounter (Signed)
Sorry delay. We did not about this out of state situation. This would require signature. Fax across state lines? Please ask Dr. Birdie Riddle.

## 2013-12-19 NOTE — Telephone Encounter (Signed)
Ok for #30, no refills 

## 2014-01-18 ENCOUNTER — Encounter: Payer: Self-pay | Admitting: Family Medicine

## 2014-01-18 ENCOUNTER — Ambulatory Visit (INDEPENDENT_AMBULATORY_CARE_PROVIDER_SITE_OTHER): Payer: BC Managed Care – PPO | Admitting: Family Medicine

## 2014-01-18 VITALS — BP 116/70 | HR 80 | Temp 98.3°F | Resp 16 | Ht 64.25 in | Wt 127.1 lb

## 2014-01-18 DIAGNOSIS — Z0000 Encounter for general adult medical examination without abnormal findings: Secondary | ICD-10-CM

## 2014-01-18 LAB — CBC WITH DIFFERENTIAL/PLATELET
Basophils Absolute: 0 10*3/uL (ref 0.0–0.1)
Basophils Relative: 0.4 % (ref 0.0–3.0)
Eosinophils Absolute: 0.1 10*3/uL (ref 0.0–0.7)
Eosinophils Relative: 1.9 % (ref 0.0–5.0)
HCT: 42.7 % (ref 36.0–46.0)
Hemoglobin: 14.6 g/dL (ref 12.0–15.0)
Lymphocytes Relative: 30.7 % (ref 12.0–46.0)
Lymphs Abs: 1.4 10*3/uL (ref 0.7–4.0)
MCHC: 34.1 g/dL (ref 30.0–36.0)
MCV: 90.7 fl (ref 78.0–100.0)
Monocytes Absolute: 0.4 10*3/uL (ref 0.1–1.0)
Monocytes Relative: 7.9 % (ref 3.0–12.0)
Neutro Abs: 2.8 10*3/uL (ref 1.4–7.7)
Neutrophils Relative %: 59.1 % (ref 43.0–77.0)
Platelets: 185 10*3/uL (ref 150.0–400.0)
RBC: 4.7 Mil/uL (ref 3.87–5.11)
RDW: 13.3 % (ref 11.5–15.5)
WBC: 4.7 10*3/uL (ref 4.0–10.5)

## 2014-01-18 LAB — LIPID PANEL
Cholesterol: 175 mg/dL (ref 0–200)
HDL: 52.9 mg/dL
LDL Cholesterol: 112 mg/dL — ABNORMAL HIGH (ref 0–99)
NonHDL: 122.1
Total CHOL/HDL Ratio: 3
Triglycerides: 52 mg/dL (ref 0.0–149.0)
VLDL: 10.4 mg/dL (ref 0.0–40.0)

## 2014-01-18 LAB — VITAMIN D 25 HYDROXY (VIT D DEFICIENCY, FRACTURES): VITD: 33.56 ng/mL (ref 30.00–100.00)

## 2014-01-18 LAB — BASIC METABOLIC PANEL
BUN: 9 mg/dL (ref 6–23)
CHLORIDE: 103 meq/L (ref 96–112)
CO2: 29 meq/L (ref 19–32)
Calcium: 9.7 mg/dL (ref 8.4–10.5)
Creatinine, Ser: 0.8 mg/dL (ref 0.4–1.2)
GFR: 97.59 mL/min (ref >60.00)
GLUCOSE: 75 mg/dL (ref 70–99)
POTASSIUM: 3.7 meq/L (ref 3.5–5.1)
SODIUM: 138 meq/L (ref 135–145)

## 2014-01-18 LAB — HEPATIC FUNCTION PANEL
ALT: 17 U/L (ref 0–35)
AST: 25 U/L (ref 0–37)
Albumin: 4.7 g/dL (ref 3.5–5.2)
Alkaline Phosphatase: 38 U/L — ABNORMAL LOW (ref 39–117)
Bilirubin, Direct: 0.2 mg/dL (ref 0.0–0.3)
Total Bilirubin: 1.3 mg/dL — ABNORMAL HIGH (ref 0.2–1.2)
Total Protein: 8.3 g/dL (ref 6.0–8.3)

## 2014-01-18 LAB — TSH: TSH: 2.05 u[IU]/mL (ref 0.35–4.50)

## 2014-01-18 NOTE — Assessment & Plan Note (Signed)
Pt's PE WNL.  UTD on GYN.  Check labs.  Anticipatory guidance provided.  

## 2014-01-18 NOTE — Progress Notes (Signed)
Pre visit review using our clinic review tool, if applicable. No additional management support is needed unless otherwise documented below in the visit note. 

## 2014-01-18 NOTE — Progress Notes (Signed)
   Subjective:    Patient ID: Renee Patterson, female    DOB: Mar 27, 1986, 27 y.o.   MRN: [id number]  HPI CPE- UTD on GYN.   Review of Systems Patient reports no vision/ hearing changes, adenopathy,fever, weight change,  persistant/recurrent hoarseness , swallowing issues, chest pain, palpitations, edema, persistant/recurrent cough, hemoptysis, dyspnea (rest/exertional/paroxysmal nocturnal), gastrointestinal bleeding (melena, rectal bleeding), abdominal pain, significant heartburn, bowel changes, GU symptoms (dysuria, hematuria, incontinence), Gyn symptoms (abnormal  bleeding, pain),  syncope, focal weakness, memory loss, numbness & tingling, skin/hair/nail changes, abnormal bruising or bleeding, anxiety, or depression.     Objective:   Physical Exam General Appearance:    Alert, cooperative, no distress, appears stated age  Head:    Normocephalic, without obvious abnormality, atraumatic  Eyes:    PERRL, conjunctiva/corneas clear, EOM's intact, fundi    benign, both eyes  Ears:    Normal TM's and external ear canals, both ears  Nose:   Nares normal, septum midline, mucosa normal, no drainage    or sinus tenderness  Throat:   Lips, mucosa, and tongue normal; teeth and gums normal  Neck:   Supple, symmetrical, trachea midline, no adenopathy;    Thyroid: no enlargement/tenderness/nodules  Back:     Symmetric, no curvature, ROM normal, no CVA tenderness  Lungs:     Clear to auscultation bilaterally, respirations unlabored  Chest Wall:    No tenderness or deformity   Heart:    Regular rate and rhythm, S1 and S2 normal, no murmur, rub   or gallop  Breast Exam:    Deferred to GYN  Abdomen:     Soft, non-tender, bowel sounds active all four quadrants,    no masses, no organomegaly  Genitalia:    Deferred to GYN  Rectal:    Extremities:   Extremities normal, atraumatic, no cyanosis or edema  Pulses:   2+ and symmetric all extremities  Skin:   Skin color, texture, turgor normal, no rashes or  lesions  Lymph nodes:   Cervical, supraclavicular, and axillary nodes normal  Neurologic:   CNII-XII intact, normal strength, sensation and reflexes    throughout          Assessment & Plan:

## 2014-01-18 NOTE — Patient Instructions (Signed)
Follow up in 1 year or as needed We'll notify you of your lab results and make any changes if needed Keep up the good work!  You look great! Call with any questions or concerns Happy Labor Day!!! 

## 2014-01-23 ENCOUNTER — Encounter: Payer: Self-pay | Admitting: General Practice

## 2014-03-17 ENCOUNTER — Other Ambulatory Visit: Payer: Self-pay | Admitting: Family Medicine

## 2014-03-18 ENCOUNTER — Encounter: Payer: Self-pay | Admitting: Family Medicine

## 2014-03-18 NOTE — Telephone Encounter (Signed)
Med filled.  

## 2014-05-29 ENCOUNTER — Telehealth: Payer: Self-pay | Admitting: Family Medicine

## 2014-05-29 MED ORDER — ALPRAZOLAM 0.25 MG PO TABS: 0.2500 mg | ORAL_TABLET | ORAL | Status: DC | PRN

## 2014-05-29 NOTE — Addendum Note (Signed)
Addended by: Kris Hartmann on: 05/29/2014 09:31 AM   Modules accepted: Orders

## 2014-05-29 NOTE — Telephone Encounter (Signed)
Med filled, will notify pt.  

## 2014-05-29 NOTE — Telephone Encounter (Signed)
Caller name: Paulena Relation to pt: self Call back number: [phone] [facility] [address]  Reason for call:   Patient states that she has anxiety and will be flying next week and wanted to know if something could be called in for her

## 2014-05-29 NOTE — Telephone Encounter (Signed)
[place] for #30 Alprazolam as listed on med list

## 2014-08-02 ENCOUNTER — Other Ambulatory Visit: Payer: Self-pay | Admitting: Obstetrics and Gynecology

## 2014-08-05 LAB — CYTOLOGY - PAP

## 2014-08-13 ENCOUNTER — Other Ambulatory Visit: Payer: Self-pay | Admitting: Family Medicine

## 2014-08-14 NOTE — Telephone Encounter (Signed)
Med filled.  

## 2014-10-25 ENCOUNTER — Other Ambulatory Visit: Payer: Self-pay | Admitting: Family Medicine

## 2014-10-25 NOTE — Telephone Encounter (Signed)
Requesting Alprazolam 0.25mg -Take 1 tablet by mouth every 4 hours as needed for anxiety. Last refill:05-29-14;#30,0  Last OV:01-18-14 No UDS done Please advise.

## 2014-11-16 ENCOUNTER — Other Ambulatory Visit: Payer: Self-pay | Admitting: Family Medicine

## 2014-11-19 NOTE — Telephone Encounter (Signed)
Med filled.  

## 2015-01-06 ENCOUNTER — Ambulatory Visit (INDEPENDENT_AMBULATORY_CARE_PROVIDER_SITE_OTHER): Payer: BLUE CROSS/BLUE SHIELD | Admitting: Family Medicine

## 2015-01-06 ENCOUNTER — Encounter: Payer: Self-pay | Admitting: Family Medicine

## 2015-01-06 VITALS — BP 120/87 | HR 80 | Temp 98.7°F | Resp 16 | Ht 64.0 in | Wt 137.5 lb

## 2015-01-06 DIAGNOSIS — N951 Menopausal and female climacteric states: Secondary | ICD-10-CM

## 2015-01-06 DIAGNOSIS — R232 Flushing: Secondary | ICD-10-CM

## 2015-01-06 NOTE — Progress Notes (Signed)
Pre visit review using our clinic review tool, if applicable. No additional management support is needed unless otherwise documented below in the visit note. 

## 2015-01-06 NOTE — Patient Instructions (Signed)
Follow up as needed We'll notify you of your lab results and make any changes if needed Make sure to ask Dr Corinna Capra about the hot flashes if everything here looks good Drink plenty of fluids Call with any questions or concerns Happy Labor Day!!!

## 2015-01-06 NOTE — Assessment & Plan Note (Signed)
New.  Pt has had 2 months of symptomatic hot flashes.  This would correspond to when pt's Mirena was 'expiring' which means it is likely out of hormones.  Unclear if this is the cause as pt is on OCPs also.  Celexa is ongoing medication for pt and should improve hot flashes rather than cause them.  Discussed that testing hormone levels while on OCPs would not be of benefit and that she would need to discuss next steps w/ GYN if TSH, CBC, and electrolytes are normal.  Pt expressed understanding and is in agreement w/ plan.

## 2015-01-06 NOTE — Progress Notes (Signed)
   Subjective:    Patient ID: Renee Patterson, female    DOB: 02/02/1986, 29 y.o.   MRN: [id number]  HPI Hot flashes- 'every day, multiple times a day for 2 months'.  Has gained 10 lbs in last year but pt reports most of this is recent.  No recent med changes.  Pt has appt to get IUD removed Sept 2.  Pt reports hot flashes will start 'from the inside out'.  On OCPs for last 8-12 months.   Review of Systems For ROS see HPI     Objective:   Physical Exam  Constitutional: She is oriented to person, place, and time. She appears well-developed and well-nourished. No distress.  HENT:  Head: Normocephalic and atraumatic.  Eyes: Conjunctivae and EOM are normal. Pupils are equal, round, and reactive to light.  Neck: Normal range of motion. Neck supple. No thyromegaly present.  Cardiovascular: Normal rate, regular rhythm, normal heart sounds and intact distal pulses.   No murmur heard. Pulmonary/Chest: Effort normal and breath sounds normal. No respiratory distress.  Abdominal: Soft. She exhibits no distension. There is no tenderness.  Musculoskeletal: She exhibits no edema.  Lymphadenopathy:    She has no cervical adenopathy.  Neurological: She is alert and oriented to person, place, and time.  Skin: Skin is warm and dry.  Psychiatric: She has a normal mood and affect. Her behavior is normal.  Vitals reviewed.         Assessment & Plan:

## 2015-01-07 ENCOUNTER — Encounter: Payer: Self-pay | Admitting: General Practice

## 2015-01-07 LAB — CBC WITH DIFFERENTIAL/PLATELET
BASOS ABS: 0.1 10*3/uL (ref 0.0–0.1)
BASOS PCT: 1.2 % (ref 0.0–3.0)
EOS ABS: 0.1 10*3/uL (ref 0.0–0.7)
Eosinophils Relative: 2 % (ref 0.0–5.0)
HEMATOCRIT: 40.4 % (ref 36.0–46.0)
HEMOGLOBIN: 13.8 g/dL (ref 12.0–15.0)
LYMPHS PCT: 27.3 % (ref 12.0–46.0)
Lymphs Abs: 1.4 10*3/uL (ref 0.7–4.0)
MCHC: 34.2 g/dL (ref 30.0–36.0)
MCV: 89.9 fl (ref 78.0–100.0)
MONOS PCT: 6.4 % (ref 3.0–12.0)
Monocytes Absolute: 0.3 10*3/uL (ref 0.1–1.0)
Neutro Abs: 3.1 10*3/uL (ref 1.4–7.7)
Neutrophils Relative %: 63.1 % (ref 43.0–77.0)
Platelets: 187 10*3/uL (ref 150.0–400.0)
RBC: 4.5 Mil/uL (ref 3.87–5.11)
RDW: 13.1 % (ref 11.5–15.5)
WBC: 4.9 10*3/uL (ref 4.0–10.5)

## 2015-01-07 LAB — BASIC METABOLIC PANEL
BUN: 10 mg/dL (ref 6–23)
CALCIUM: 9.6 mg/dL (ref 8.4–10.5)
CO2: 30 meq/L (ref 19–32)
Chloride: 102 mEq/L (ref 96–112)
Creatinine, Ser: 0.64 mg/dL (ref 0.40–1.20)
GFR: 116.4 mL/min (ref >60.00)
Glucose, Bld: 83 mg/dL (ref 70–99)
POTASSIUM: 4.2 meq/L (ref 3.5–5.1)
SODIUM: 138 meq/L (ref 135–145)

## 2015-01-07 LAB — TSH: TSH: 1.99 u[IU]/mL (ref 0.35–4.50)

## 2015-01-08 ENCOUNTER — Telehealth: Payer: Self-pay | Admitting: Family Medicine

## 2015-01-08 NOTE — Telephone Encounter (Signed)
Called pt and left a detailed message informing of results.

## 2015-01-08 NOTE — Telephone Encounter (Signed)
Relation to pt: self  Call back number:[phone]   Reason for call:  Patient requesting lab results

## 2015-02-05 ENCOUNTER — Encounter: Payer: Self-pay | Admitting: Physician Assistant

## 2015-02-05 ENCOUNTER — Ambulatory Visit (INDEPENDENT_AMBULATORY_CARE_PROVIDER_SITE_OTHER): Payer: BLUE CROSS/BLUE SHIELD | Admitting: Physician Assistant

## 2015-02-05 VITALS — BP 120/71 | HR 69 | Ht 64.0 in | Wt 138.0 lb

## 2015-02-05 DIAGNOSIS — R5383 Other fatigue: Secondary | ICD-10-CM | POA: Insufficient documentation

## 2015-02-05 DIAGNOSIS — R10819 Abdominal tenderness, unspecified site: Secondary | ICD-10-CM

## 2015-02-05 DIAGNOSIS — D241 Benign neoplasm of right breast: Secondary | ICD-10-CM

## 2015-02-05 DIAGNOSIS — R232 Flushing: Secondary | ICD-10-CM

## 2015-02-05 DIAGNOSIS — N644 Mastodynia: Secondary | ICD-10-CM

## 2015-02-05 DIAGNOSIS — R590 Localized enlarged lymph nodes: Secondary | ICD-10-CM

## 2015-02-05 DIAGNOSIS — R635 Abnormal weight gain: Secondary | ICD-10-CM | POA: Insufficient documentation

## 2015-02-05 DIAGNOSIS — N951 Menopausal and female climacteric states: Secondary | ICD-10-CM

## 2015-02-05 DIAGNOSIS — D249 Benign neoplasm of unspecified breast: Secondary | ICD-10-CM | POA: Insufficient documentation

## 2015-02-05 DIAGNOSIS — Z803 Family history of malignant neoplasm of breast: Secondary | ICD-10-CM

## 2015-02-05 NOTE — Progress Notes (Signed)
Subjective:    Patient ID: Renee Patterson, female    DOB: May 21, 1985, 29 y.o.   MRN: [id number]  HPI  Patient is a 29 year old female who presents to the clinic today as a new patient. She has multiple concerns today.  .. Active Ambulatory Problems    Diagnosis Date Noted  . DIZZINESS 01/08/2010  . URI 07/02/2010  . RHINITIS 07/31/2010  . HEADACHE 07/31/2010  . Anxiety and depression 08/13/2010  . Neoplasm of uncertain behavior of skin 12/07/2010  . Memory loss 02/10/2011  . Pleuritic chest pain 06/11/2011  . Routine general medical examination at a health care facility 01/18/2014  . Hot flashes 01/06/2015   Resolved Ambulatory Problems    Diagnosis Date Noted  . DEPRESSION 01/08/2010  . Breast mass 09/20/2011   Past Medical History  Diagnosis Date  . Depression   . Anxiety   . PONV (postoperative nausea and vomiting)    .Marland Kitchen Family History  Problem Relation Age of Onset  . Hypertension Mother   . Breast cancer      Maternal Great Grandmother  . Cancer Paternal Grandmother     skin   .Marland Kitchen Social History   Social History  . Marital Status: Married    Spouse Name: N/A  . Number of Children: N/A  . Years of Education: N/A   Occupational History  . Not on file.   Social History Main Topics  . Smoking status: Never Smoker   . Smokeless tobacco: Never Used  . Alcohol Use: Yes  . Drug Use: No  . Sexual Activity: Yes    Birth Control/ Protection: IUD   Other Topics Concern  . Not on file   Social History Narrative   Patient's main concern today is a lump under her left armpit that is tender and seems to be increasing in size. She first noticed lymph node about a week ago. She thought she also felt some on the right side as well but it has resolved. Sunday and Monday night they were both extremely painful and work her up. She denies any fever, sinus pressure, tick bite, injury. She does have a history of breast adenomas of the right side. She also does complain of  hot flashes and night sweats for about 3-4 months. 2 or 3 weeks ago she did get a Mirena and stopped her oral birth control pills. She works as a Art therapist and the hot flashes affect her while she is working a lot. About 1 week ago she tapered off of Celexa and started Wellbutrin. She does admit her mood is more irritable. Patient feels fatigued during the day but not able to sleep at night. She also admits that she is gaining weight. Despite watching her diet and exercising 30 minutes each day. She is put on approximately 8 pounds from June of this year.   Review of Systems  All other systems reviewed and are negative.      Objective:   Physical Exam  Constitutional: She is oriented to person, place, and time. She appears well-developed and well-nourished.  HENT:  Head: Normocephalic and atraumatic.  Right Ear: External ear normal.  Left Ear: External ear normal.  Nose: Nose normal.  Mouth/Throat: Oropharynx is clear and moist. No oropharyngeal exudate.  Neck: Normal range of motion. Neck supple.  No inguinal lymphadenopathy.   Cardiovascular: Normal rate, regular rhythm and normal heart sounds.   Pulmonary/Chest: Effort normal and breath sounds normal.    Abdominal: Soft. Bowel sounds are  normal.  Some slight tenderness over the lower bilateral abdomen and just above the suprapubic area. No guarding or rebound noted  Lymphadenopathy:    She has no cervical adenopathy.  Neurological: She is alert and oriented to person, place, and time.  Skin: Skin is dry.  Psychiatric: She has a normal mood and affect. Her behavior is normal.          Assessment & Plan:  Axillary left-sided lymphadenopathy/weight gain/fatigue/hot flashes-unclear etiology at this time. We're doing extensive hormonal workup for hot flashes. There is no known cause for the swollen lymph nodes of her left axilla. Does not seem to be infectious. Will test a mono and CMV as well as on disease titers. We'll  recheck a CBC and do a peripheral smear. I do not think it's likely that this is lymphoma however we have to keep this on our differential. She has experienced weight gain. Which leads me to believe there is some hormonal issue going on here. I would like to proceed with ultrasound of breast extending into the axilla. She does have a history of adenomas of the breast as well as a family history of breast cancer with her grandmother. Some of her symptomsm he COS and she does admit to have an cyst in her history. Would like to also get an ultrasound of her pelvis. We could also check placement of IUD at this time. She did just get the IUD placed around 2-3 weeks ago.

## 2015-02-06 LAB — TESTOSTERONE, FREE, TOTAL, SHBG
SEX HORMONE BINDING: 52 nmol/L (ref 17–124)
TESTOSTERONE FREE: 4.8 pg/mL (ref 0.6–6.8)
TESTOSTERONE-% FREE: 1.3 % (ref 0.4–2.4)
Testosterone: 36 ng/dL (ref 10–70)

## 2015-02-06 LAB — CBC WITH DIFFERENTIAL/PLATELET
BASOS ABS: 0 10*3/uL (ref 0.0–0.1)
Basophils Relative: 1 % (ref 0–1)
EOS PCT: 3 % (ref 0–5)
Eosinophils Absolute: 0.1 10*3/uL (ref 0.0–0.7)
HEMATOCRIT: 38.4 % (ref 36.0–46.0)
HEMOGLOBIN: 13.5 g/dL (ref 12.0–15.0)
LYMPHS ABS: 1.3 10*3/uL (ref 0.7–4.0)
LYMPHS PCT: 33 % (ref 12–46)
MCH: 31 pg (ref 26.0–34.0)
MCHC: 35.2 g/dL (ref 30.0–36.0)
MCV: 88.1 fL (ref 78.0–100.0)
MPV: 9.4 fL (ref 8.6–12.4)
Monocytes Absolute: 0.3 10*3/uL (ref 0.1–1.0)
Monocytes Relative: 8 % (ref 3–12)
NEUTROS ABS: 2.2 10*3/uL (ref 1.7–7.7)
NEUTROS PCT: 55 % (ref 43–77)
Platelets: 165 10*3/uL (ref 150–400)
RBC: 4.36 MIL/uL (ref 3.87–5.11)
RDW: 13.9 % (ref 11.5–15.5)
WBC: 4 10*3/uL (ref 4.0–10.5)

## 2015-02-06 LAB — PATHOLOGIST SMEAR REVIEW

## 2015-02-06 LAB — VITAMIN D 25 HYDROXY (VIT D DEFICIENCY, FRACTURES): VIT D 25 HYDROXY: 29 ng/mL — AB (ref 30–100)

## 2015-02-06 LAB — LYME AB/WESTERN BLOT REFLEX: B BURGDORFERI AB IGG+ IGM: 0.14 {ISR}

## 2015-02-06 LAB — DHEA-SULFATE: DHEA SO4: 165 ug/dL (ref 18–391)

## 2015-02-06 LAB — LUTEINIZING HORMONE: LH: 2.4 m[IU]/mL

## 2015-02-06 LAB — EPSTEIN-BARR VIRUS VCA ANTIBODY PANEL
EBV EA IgG: 29.3 U/mL — ABNORMAL HIGH (ref <9.0)
EBV NA IgG: >600 U/mL — ABNORMAL HIGH (ref <18.0)
EBV VCA IgG: 169 U/mL — ABNORMAL HIGH (ref <18.0)

## 2015-02-06 LAB — FOLLICLE STIMULATING HORMONE: FSH: 5.5 m[IU]/mL

## 2015-02-06 LAB — SEDIMENTATION RATE: SED RATE: 1 mm/h (ref 0–20)

## 2015-02-06 LAB — VITAMIN B12: VITAMIN B 12: 378 pg/mL (ref 211–911)

## 2015-02-06 LAB — PROLACTIN: PROLACTIN: 6.4 ng/mL

## 2015-02-07 ENCOUNTER — Ambulatory Visit: Payer: BLUE CROSS/BLUE SHIELD | Admitting: Family Medicine

## 2015-02-07 LAB — CMV IGM: CMV IGM: 22.5 [AU]/ml (ref <30.00)

## 2015-02-07 LAB — CYTOMEGALOVIRUS ANTIBODY, IGG: CYTOMEGALOVIRUS AB-IGG: 8.7 U/mL — AB (ref <0.60)

## 2015-02-09 ENCOUNTER — Telehealth: Payer: Self-pay | Admitting: Emergency Medicine

## 2015-02-09 ENCOUNTER — Emergency Department (INDEPENDENT_AMBULATORY_CARE_PROVIDER_SITE_OTHER): Payer: BLUE CROSS/BLUE SHIELD

## 2015-02-09 ENCOUNTER — Emergency Department
Admission: EM | Admit: 2015-02-09 | Discharge: 2015-02-09 | Disposition: A | Discharge status: Home / Self Care | Payer: BLUE CROSS/BLUE SHIELD | Source: Home / Self Care | Attending: Family Medicine | Admitting: Family Medicine

## 2015-02-09 ENCOUNTER — Encounter: Payer: Self-pay | Admitting: Emergency Medicine

## 2015-02-09 DIAGNOSIS — R079 Chest pain, unspecified: Secondary | ICD-10-CM

## 2015-02-09 DIAGNOSIS — R590 Localized enlarged lymph nodes: Secondary | ICD-10-CM

## 2015-02-09 DIAGNOSIS — M5489 Other dorsalgia: Secondary | ICD-10-CM | POA: Diagnosis not present

## 2015-02-09 DIAGNOSIS — M549 Dorsalgia, unspecified: Secondary | ICD-10-CM

## 2015-02-09 DIAGNOSIS — M25512 Pain in left shoulder: Secondary | ICD-10-CM | POA: Diagnosis not present

## 2015-02-09 DIAGNOSIS — M546 Pain in thoracic spine: Secondary | ICD-10-CM | POA: Diagnosis not present

## 2015-02-09 NOTE — ED Notes (Signed)
Patient presents to [facility] with C/O severe upper left back pain denies injury pain times 4 days worse with movement, denies SOB or cough patient recently lump under her left armpit that is tender and seems to be increasing in size.was seen for a lump under the left arm pit increasing in size.

## 2015-02-09 NOTE — ED Provider Notes (Signed)
CSN: [id number]     Arrival date & time 02/09/15  1435 History   None    Chief Complaint  Patient presents with  . Back Pain   (Consider location/radiation/quality/duration/timing/severity/associated sxs/prior Treatment) HPI Pt is a 29yo female accompanied by mother presenting to [facility] with c/o Left upper back pain and Left upper chest pain that started 4 days ago. Intermittent. Worse with certain movements and deep breaths but denies SOB.  Minimal pain with palpation. Denies heavy lifting or recent falls. No prior shoulder injuries or surgeries. Pt currently being worked up by her PCP, Iran Planas PA-C for Left axillary lymphadenopathy.  So far, labs have been unremarkable. Ultrasound of left axilla has been ordered but pt states no one has called to have U/S scheduled.  Denies fever, chills, n/v/d.   Past Medical History  Diagnosis Date  . Depression   . Anxiety   . PONV (postoperative nausea and vomiting)    Past Surgical History  Procedure Laterality Date  . Anterior cruciate ligament repair  2003  . Knee surgery      to remove staple from ACL surgery.  . Cholecystectomy      2010  . Mass excision  02/04/2012    Procedure: EXCISION MASS;  Surgeon: Rolm Bookbinder, MD;  Location: [place];  Service: General;  Laterality: Right;  right breast mass excision   Family History  Problem Relation Age of Onset  . Hypertension Mother   . Breast cancer      Maternal Great Grandmother  . Cancer Paternal Grandmother     skin   Social History  Substance Use Topics  . Smoking status: Never Smoker   . Smokeless tobacco: Never Used  . Alcohol Use: Yes   OB History    Gravida Para Term Preterm AB TAB SAB Ectopic Multiple Living   2 2             Review of Systems  Constitutional: Negative for fever and chills.  HENT: Negative for congestion, ear pain, sore throat, trouble swallowing and voice change.   Respiratory: Negative for cough and shortness of breath.     Cardiovascular: Positive for chest pain. Negative for palpitations.  Gastrointestinal: Negative for nausea, vomiting, abdominal pain and diarrhea.  Musculoskeletal: Positive for back pain and arthralgias. Negative for myalgias.       Left upper back, Left shoulder  Skin: Negative for rash.  All other systems reviewed and are negative.   Allergies  Nitrofurantoin  Home Medications   Prior to Admission medications   Medication Sig Start Date End Date Taking? Authorizing Provider  buPROPion (WELLBUTRIN XL) 150 MG 24 hr tablet Take 150 mg by mouth daily.    Historical Provider, MD  levonorgestrel (MIRENA) 20 MCG/24HR IUD 1 each by Intrauterine route once.      Historical Provider, MD   Meds Ordered and Administered this Visit  Medications - No data to display  BP 120/79 mmHg  Pulse 94  Temp(Src) 98.3 F (36.8 C) (Oral)  Resp 16  Ht 5\' 4"  (1.626 m)  Wt 139 lb 12 oz (63.39 kg)  BMI 23.98 kg/m2  SpO2 100%  LMP 02/09/2015 No data found.   Physical Exam  Constitutional: She appears well-developed and well-nourished. No distress.  HENT:  Head: Normocephalic and atraumatic.  Eyes: Conjunctivae are normal. No scleral icterus.  Neck: Normal range of motion. Neck supple.  Cardiovascular: Normal rate, regular rhythm and normal heart sounds.   Pulmonary/Chest: Effort normal and  breath sounds normal. No respiratory distress. She has no wheezes. She has no rales. She exhibits tenderness.    Mild tenderness to Left upper chest wall. Left axillary lymphadenopathy   Abdominal: Soft. She exhibits no distension and no mass. There is no tenderness. There is no rebound and no guarding.  Musculoskeletal: Normal range of motion. She exhibits tenderness. She exhibits no edema.  No midline spinal tenderness. FROM upper and lower extremities with 5/5 strength bilaterally. Mild tenderness of Left upper trapezius, worse with arm in overhead position. Mild tenderness to anterior shoulder. No  deformity.   Neurological: She is alert.  Skin: Skin is warm and dry. She is not diaphoretic.  Nursing note and vitals reviewed.   ED Course  Procedures (including critical care time)  Labs Review Labs Reviewed - No data to display  Imaging Review Dg Chest 2 View  02/09/2015   CLINICAL DATA:  28 year old female severe upper back pain on the left side for the past 4 days. Pain increases with deep inspiration.  EXAM: CHEST  2 VIEW  COMPARISON:  No priors.  FINDINGS: Lung volumes are normal. No consolidative airspace disease. No pleural effusions. No pneumothorax. No pulmonary nodule or mass noted. Pulmonary vasculature and the cardiomediastinal silhouette are within normal limits. Surgical clips project over the right upper quadrant of the abdomen, likely from prior cholecystectomy.  IMPRESSION: No radiographic evidence of acute cardiopulmonary disease.   Electronically Signed   By: Vinnie Langton M.D.   On: 02/09/2015 15:47   Dg Shoulder Left  02/09/2015   CLINICAL DATA:  Posterior left shoulder pain for 4 days. No known injury. Initial encounter.  EXAM: LEFT SHOULDER - 2+ VIEW  COMPARISON:  None.  FINDINGS: There is no evidence of fracture or dislocation. There is no evidence of arthropathy or other focal bone abnormality. Soft tissues are unremarkable.  IMPRESSION: Negative exam.   Electronically Signed   By: Inge Rise M.D.   On: 02/09/2015 15:42      MDM   1. Upper back pain on left side   2. Left sided chest pain   3. Lymphadenopathy, axillary     Pt c/o Left upper chest and back pain w/o known injury. Currently having Left axillary lyphadenopathy being worked up by PCP.  U/S ordered but not scheduled yet. Pt denies SOB. O2 Sat- 100% denies leg pain or swelling. Doubt PE. Pain is reproducible with palpation and movement of her Left arm. Plain films: negative for cardiopulmonary disease or bony abnormalities. Reassured pt of normal plain films today, however, encouraged  her to continue with plan for U/S. Encouraged to call tomorrow to schedule ultrasound and f/u with PCP to discuss labs that were drawn last week. Patient and mother verbalized understanding and agreement with treatment plan.     Noland Fordyce, PA-C 02/09/15 [phone]

## 2015-02-10 ENCOUNTER — Other Ambulatory Visit: Payer: Self-pay | Admitting: Physician Assistant

## 2015-02-10 MED ORDER — PREDNISONE 50 MG PO TABS: ORAL_TABLET | ORAL | Status: DC

## 2015-02-10 NOTE — Progress Notes (Signed)
Pt calls in back still hurting and now moving to right side. UC visit over weekend. Will send over prednisone burst.

## 2015-02-11 ENCOUNTER — Other Ambulatory Visit: Payer: BLUE CROSS/BLUE SHIELD

## 2015-02-11 ENCOUNTER — Encounter: Payer: Self-pay | Admitting: General Practice

## 2015-02-11 NOTE — Telephone Encounter (Signed)
Pt was sent a mychart message to inform.   Renee Patterson

## 2015-02-11 NOTE — Telephone Encounter (Signed)
-----   Message from Midge Minium, MD sent at 02/11/2015  9:13 AM EDT ----- Regarding: RE: [place] Since pt has new PCP and I am not familiar w/ what's going I am not able to comment at this time.  kt ----- Message -----    From: Davis Gourd, CMA    Sent: 02/11/2015   8:57 AM      To: Midge Minium, MD Subject: [facility] Alar: Second Opinion - Labs                        ----- Message -----    From: Margot Ables    Sent: 02/11/2015   8:43 AM      To: Davis Gourd, CMA Subject: Second Opinion - Labs                          Pt has changed to a provider at [facility] since Dr. Birdie Riddle is leaving. She is worried about lab results she has received from new provider and asking if Dr. Birdie Riddle would offer her advice. I wasn't sure since pt has now changed providers.  Ph# [phone]

## 2015-02-21 ENCOUNTER — Ambulatory Visit
Admission: RE | Admit: 2015-02-21 | Discharge: 2015-02-21 | Disposition: A | Discharge status: Home / Self Care | Payer: BLUE CROSS/BLUE SHIELD | Source: Ambulatory Visit | Attending: Physician Assistant | Admitting: Physician Assistant

## 2015-07-15 ENCOUNTER — Other Ambulatory Visit: Payer: Self-pay | Admitting: Physician Assistant

## 2015-07-15 MED ORDER — CIPROFLOXACIN HCL 500 MG PO TABS: 500.0000 mg | ORAL_TABLET | Freq: Two times a day (BID) | ORAL | Status: DC

## 2015-07-15 MED ORDER — ALPRAZOLAM 0.5 MG PO TABS: 0.5000 mg | ORAL_TABLET | Freq: Two times a day (BID) | ORAL | Status: AC | PRN

## 2015-07-15 MED ORDER — SCOPOLAMINE 1 MG/3DAYS TD PT72: 1.0000 | MEDICATED_PATCH | TRANSDERMAL | Status: DC

## 2015-07-15 NOTE — Progress Notes (Signed)
Going on a cruise. Needed a refill on xanax for anxiety. rx for sea sickness and potential diarrhea. Going to [place] and [place].

## 2016-01-16 ENCOUNTER — Other Ambulatory Visit: Payer: Self-pay | Admitting: Physician Assistant

## 2016-01-16 MED ORDER — BUPROPION HCL ER (XL) 150 MG PO TB24: 150.0000 mg | ORAL_TABLET | Freq: Every day | ORAL | 1 refills | Status: DC

## 2016-04-10 ENCOUNTER — Other Ambulatory Visit: Payer: Self-pay | Admitting: Physician Assistant

## 2016-06-23 ENCOUNTER — Other Ambulatory Visit: Payer: Self-pay | Admitting: Physician Assistant

## 2016-06-23 MED ORDER — ESCITALOPRAM OXALATE 20 MG PO TABS: 20.0000 mg | ORAL_TABLET | Freq: Every day | ORAL | 1 refills | Status: DC

## 2016-06-23 NOTE — Progress Notes (Signed)
Pt will make appt!

## 2016-07-20 ENCOUNTER — Other Ambulatory Visit: Payer: Self-pay | Admitting: *Deleted

## 2016-07-20 MED ORDER — ESCITALOPRAM OXALATE 20 MG PO TABS: 20.0000 mg | ORAL_TABLET | Freq: Every day | ORAL | 0 refills | Status: DC

## 2016-07-21 ENCOUNTER — Other Ambulatory Visit: Payer: Self-pay | Admitting: Physician Assistant

## 2016-07-21 MED ORDER — ESCITALOPRAM OXALATE 20 MG PO TABS: 20.0000 mg | ORAL_TABLET | Freq: Every day | ORAL | 1 refills | Status: DC

## 2016-12-20 ENCOUNTER — Other Ambulatory Visit: Payer: Self-pay | Admitting: Physician Assistant

## 2016-12-28 ENCOUNTER — Encounter: Payer: Self-pay | Admitting: Physician Assistant

## 2016-12-28 ENCOUNTER — Ambulatory Visit (INDEPENDENT_AMBULATORY_CARE_PROVIDER_SITE_OTHER): Payer: Self-pay | Admitting: Physician Assistant

## 2016-12-28 VITALS — BP 118/74 | HR 68 | Wt 137.0 lb

## 2016-12-28 DIAGNOSIS — R41840 Attention and concentration deficit: Secondary | ICD-10-CM

## 2016-12-28 DIAGNOSIS — F411 Generalized anxiety disorder: Secondary | ICD-10-CM

## 2016-12-28 MED ORDER — ESCITALOPRAM OXALATE 20 MG PO TABS: 20.0000 mg | ORAL_TABLET | Freq: Every day | ORAL | 4 refills | Status: AC

## 2016-12-28 NOTE — Progress Notes (Signed)
   Subjective:    Patient ID: Renee Patterson, female    DOB: 1986/03/04, 31 y.o.   MRN: [id number]  HPI Pt is a 31 yo female who presents to the clinic for medication refill for GAD.   GAD- she is on lexapro and working really well for anxiety. She ran out of it for a few days and could tell a big difference. She continues to have problems focusing despite her anxiety is so much better. She struggles to complete task and starts many different projects without completing task. She feels like she could have some adult ADHD and would like to be tested. 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 She is concerned about doing well on test because she cannot concentrate.  .. Active Ambulatory Problems    Diagnosis Date Noted  . DIZZINESS 01/08/2010  . HEADACHE 07/31/2010  . Anxiety and depression 08/13/2010  . Neoplasm of uncertain behavior of skin 12/07/2010  . Memory loss 02/10/2011  . Pleuritic chest pain 06/11/2011  . Routine general medical examination at a health care facility 01/18/2014  . Hot flashes 01/06/2015  . Weight gain 02/05/2015  . Breast adenoma 02/05/2015  . Other fatigue 02/05/2015   Resolved Ambulatory Problems    Diagnosis Date Noted  . DEPRESSION 01/08/2010  . URI 07/02/2010  . RHINITIS 07/31/2010  . Breast mass 09/20/2011  . Axillary lymphadenopathy 02/05/2015   Past Medical History:  Diagnosis Date  . Anxiety   . Depression   . PONV (postoperative nausea and vomiting)       Review of Systems  All other systems reviewed and are negative.      Objective:   Physical Exam  Constitutional: She is oriented to person, place, and time. She appears well-developed and well-nourished.  HENT:  Head: Normocephalic and atraumatic.  Cardiovascular: Normal rate, regular rhythm and normal heart sounds.   Pulmonary/Chest: Effort normal and breath sounds normal.  Neurological: She is alert and oriented to person, place, and time.   Psychiatric: She has a normal mood and affect. Her behavior is normal.          Assessment & Plan:  Marland KitchenMarland KitchenAlisandra was seen today for follow-up.  Diagnoses and all orders for this visit:  GAD (generalized anxiety disorder) -     escitalopram (LEXAPRO) 20 MG tablet; Take 1 tablet (20 mg total) by mouth daily. -     Ambulatory referral to Psychology  Inattention -     Ambulatory referral to Psychology    GAD 7 : Generalized Anxiety Score 12/28/2016  Nervous, Anxious, on Edge 1  Control/stop worrying 1  Worry too much - different things 1  Trouble relaxing 1  Restless 2  Easily annoyed or irritable 1  Afraid - awful might happen 2  Total GAD 7 Score 9    .. Depression screen [facility] 2/9 12/28/2016  Decreased Interest 0  Down, Depressed, Hopeless 1  PHQ - 2 Score 1  Altered sleeping 0  Tired, decreased energy 0  Change in appetite 0  Feeling bad or failure about yourself  0  Trouble concentrating 0  Moving slowly or fidgety/restless 0  Suicidal thoughts 0  PHQ-9 Score 1   Will send for ADHD testing. Refilled lexapro.

## 2017-07-04 ENCOUNTER — Other Ambulatory Visit: Payer: Self-pay | Admitting: Physician Assistant

## 2017-07-04 DIAGNOSIS — N644 Mastodynia: Secondary | ICD-10-CM

## 2017-07-07 IMAGING — CR DG SHOULDER 2+V*L*
3 series · 3 of 3 positions shown · non-contrast
Comparison: None.

CLINICAL DATA: Posterior left shoulder pain for 4 days. No known
injury. Initial encounter.

EXAM:
LEFT SHOULDER - 2+ VIEW

[shoulder grashey]
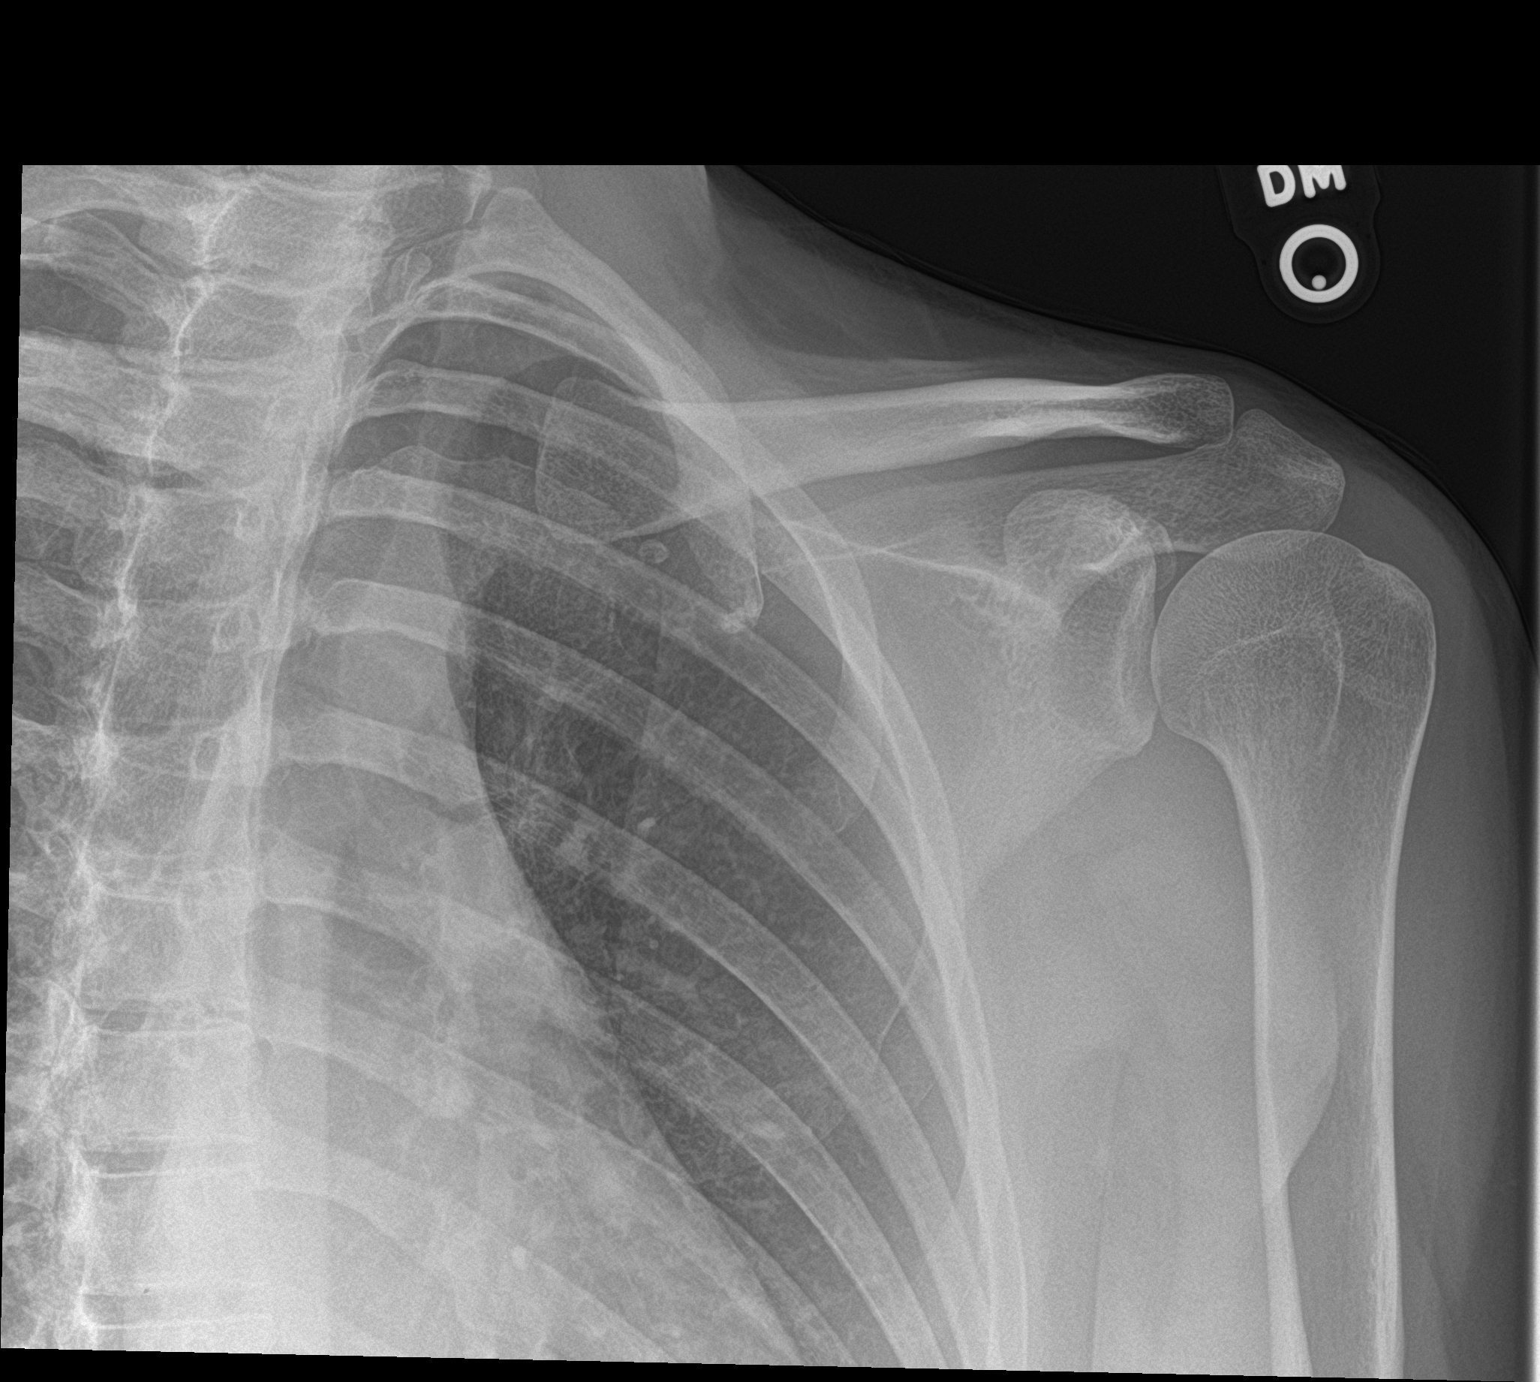

[shoulder y view]
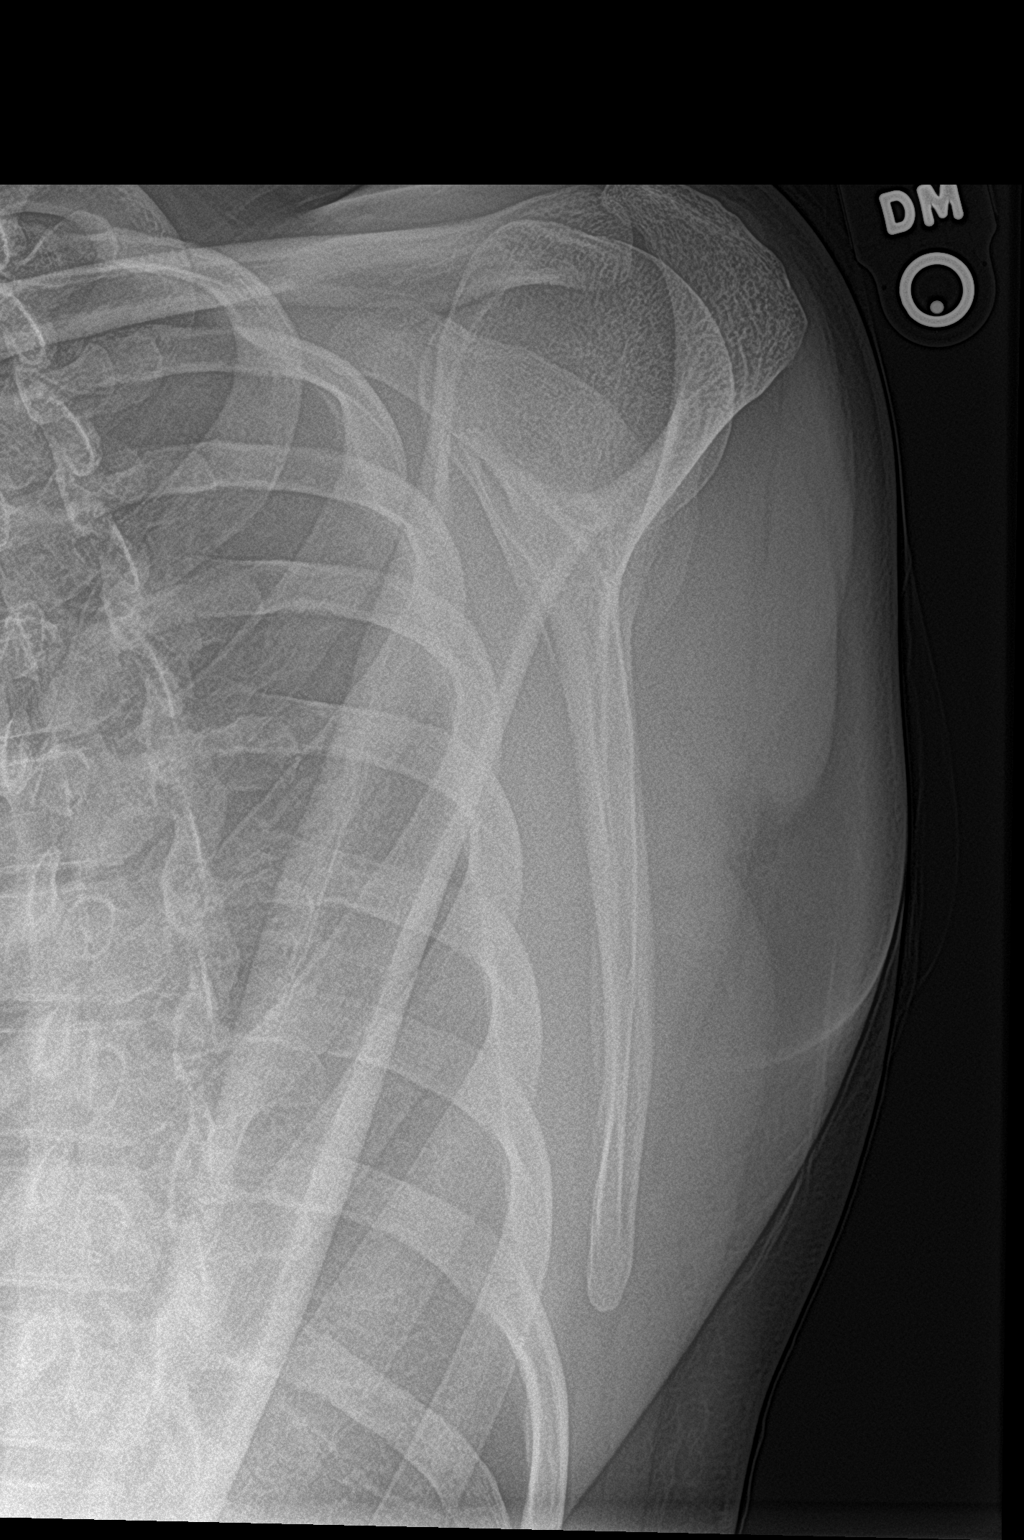

[shoulder axillary]
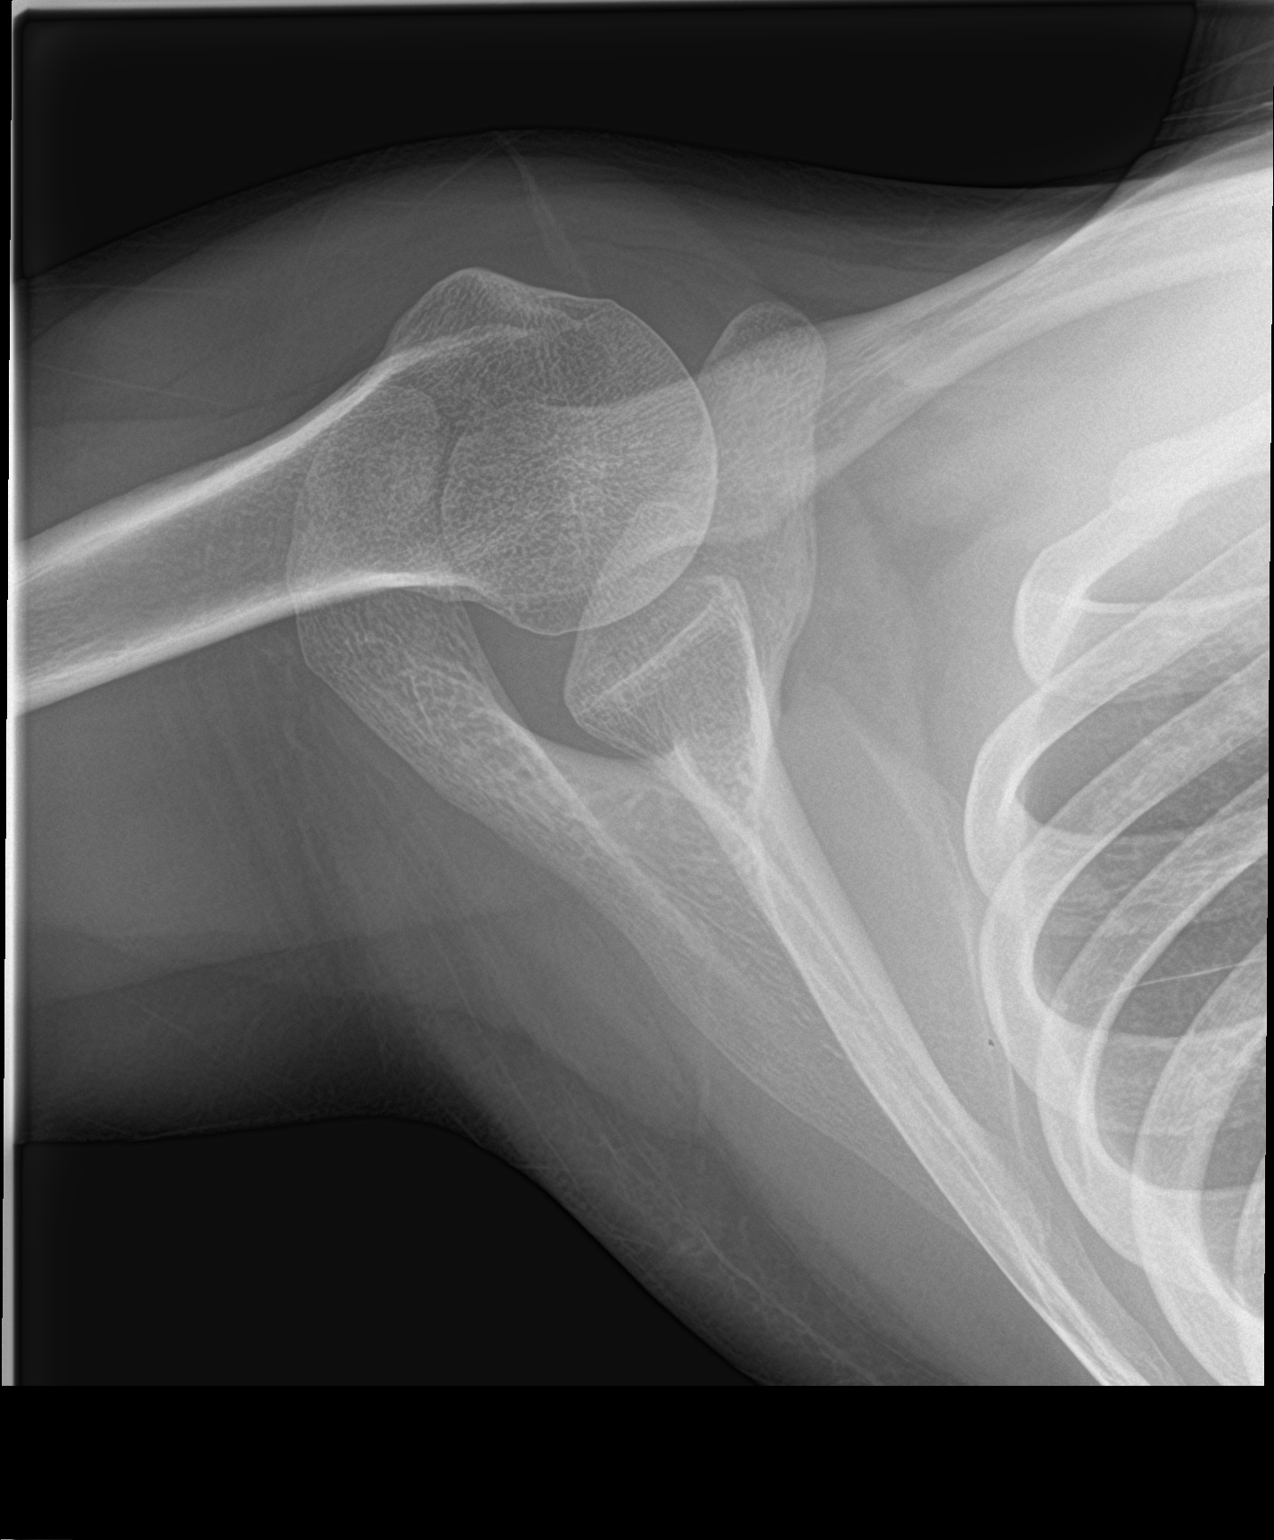

[3 of 3 positions shown; findings below may reference images not displayed]

FINDINGS: There is no evidence of fracture or dislocation. There is no
evidence of arthropathy or other focal bone abnormality. Soft
tissues are unremarkable.
IMPRESSION: Negative exam.

## 2020-05-01 ENCOUNTER — Other Ambulatory Visit: Payer: Self-pay | Admitting: Family Medicine

## 2020-05-01 DIAGNOSIS — R1031 Right lower quadrant pain: Secondary | ICD-10-CM

## 2020-05-06 ENCOUNTER — Other Ambulatory Visit: Payer: Self-pay

## 2020-08-25 DIAGNOSIS — M7732 Calcaneal spur, left foot: Secondary | ICD-10-CM | POA: Diagnosis not present

## 2020-08-25 DIAGNOSIS — M79672 Pain in left foot: Secondary | ICD-10-CM | POA: Diagnosis not present

## 2020-08-27 DIAGNOSIS — Z6825 Body mass index (BMI) 25.0-25.9, adult: Secondary | ICD-10-CM | POA: Diagnosis not present

## 2020-08-27 DIAGNOSIS — M7989 Other specified soft tissue disorders: Secondary | ICD-10-CM | POA: Diagnosis not present

## 2020-08-27 DIAGNOSIS — M79672 Pain in left foot: Secondary | ICD-10-CM | POA: Diagnosis not present

## 2020-08-27 DIAGNOSIS — M7732 Calcaneal spur, left foot: Secondary | ICD-10-CM | POA: Diagnosis not present

## 2020-08-27 DIAGNOSIS — F411 Generalized anxiety disorder: Secondary | ICD-10-CM | POA: Diagnosis not present

## 2020-09-21 DIAGNOSIS — R197 Diarrhea, unspecified: Secondary | ICD-10-CM | POA: Diagnosis not present

## 2020-09-23 DIAGNOSIS — F411 Generalized anxiety disorder: Secondary | ICD-10-CM | POA: Diagnosis not present

## 2020-09-23 DIAGNOSIS — R10815 Periumbilic abdominal tenderness: Secondary | ICD-10-CM | POA: Diagnosis not present

## 2021-01-21 DIAGNOSIS — Z975 Presence of (intrauterine) contraceptive device: Secondary | ICD-10-CM | POA: Diagnosis not present

## 2021-01-21 DIAGNOSIS — Z3009 Encounter for other general counseling and advice on contraception: Secondary | ICD-10-CM | POA: Diagnosis not present

## 2021-03-23 DIAGNOSIS — Z1231 Encounter for screening mammogram for malignant neoplasm of breast: Secondary | ICD-10-CM | POA: Diagnosis not present

## 2021-04-01 DIAGNOSIS — N938 Other specified abnormal uterine and vaginal bleeding: Secondary | ICD-10-CM | POA: Diagnosis not present

## 2021-04-01 DIAGNOSIS — R1030 Lower abdominal pain, unspecified: Secondary | ICD-10-CM | POA: Diagnosis not present

## 2021-04-02 DIAGNOSIS — R102 Pelvic and perineal pain: Secondary | ICD-10-CM | POA: Diagnosis not present

## 2021-04-02 DIAGNOSIS — N83209 Unspecified ovarian cyst, unspecified side: Secondary | ICD-10-CM | POA: Diagnosis not present

## 2021-04-02 DIAGNOSIS — N938 Other specified abnormal uterine and vaginal bleeding: Secondary | ICD-10-CM | POA: Diagnosis not present

## 2021-06-10 DIAGNOSIS — K120 Recurrent oral aphthae: Secondary | ICD-10-CM | POA: Diagnosis not present

## 2021-06-10 DIAGNOSIS — Z6825 Body mass index (BMI) 25.0-25.9, adult: Secondary | ICD-10-CM | POA: Diagnosis not present

## 2021-06-10 DIAGNOSIS — Z01419 Encounter for gynecological examination (general) (routine) without abnormal findings: Secondary | ICD-10-CM | POA: Diagnosis not present

## 2021-06-10 DIAGNOSIS — N83201 Unspecified ovarian cyst, right side: Secondary | ICD-10-CM | POA: Diagnosis not present

## 2021-06-15 DIAGNOSIS — N939 Abnormal uterine and vaginal bleeding, unspecified: Secondary | ICD-10-CM | POA: Diagnosis not present

## 2021-06-26 DIAGNOSIS — J029 Acute pharyngitis, unspecified: Secondary | ICD-10-CM | POA: Diagnosis not present

## 2021-06-26 DIAGNOSIS — J0100 Acute maxillary sinusitis, unspecified: Secondary | ICD-10-CM | POA: Diagnosis not present

## 2021-06-26 DIAGNOSIS — R0982 Postnasal drip: Secondary | ICD-10-CM | POA: Diagnosis not present

## 2021-06-26 DIAGNOSIS — J028 Acute pharyngitis due to other specified organisms: Secondary | ICD-10-CM | POA: Diagnosis not present

## 2021-06-26 DIAGNOSIS — J101 Influenza due to other identified influenza virus with other respiratory manifestations: Secondary | ICD-10-CM | POA: Diagnosis not present

## 2021-07-01 DIAGNOSIS — R42 Dizziness and giddiness: Secondary | ICD-10-CM | POA: Diagnosis not present

## 2021-07-01 DIAGNOSIS — R0789 Other chest pain: Secondary | ICD-10-CM | POA: Diagnosis not present

## 2021-07-01 DIAGNOSIS — J029 Acute pharyngitis, unspecified: Secondary | ICD-10-CM | POA: Diagnosis not present

## 2021-07-03 DIAGNOSIS — J069 Acute upper respiratory infection, unspecified: Secondary | ICD-10-CM | POA: Diagnosis not present

## 2021-07-03 DIAGNOSIS — R051 Acute cough: Secondary | ICD-10-CM | POA: Diagnosis not present

## 2021-07-03 DIAGNOSIS — J029 Acute pharyngitis, unspecified: Secondary | ICD-10-CM | POA: Diagnosis not present

## 2021-07-21 DIAGNOSIS — J029 Acute pharyngitis, unspecified: Secondary | ICD-10-CM | POA: Diagnosis not present

## 2021-07-24 DIAGNOSIS — R070 Pain in throat: Secondary | ICD-10-CM | POA: Diagnosis not present

## 2021-07-30 DIAGNOSIS — H6123 Impacted cerumen, bilateral: Secondary | ICD-10-CM | POA: Diagnosis not present

## 2021-07-30 DIAGNOSIS — H61303 Acquired stenosis of external ear canal, unspecified, bilateral: Secondary | ICD-10-CM | POA: Diagnosis not present

## 2021-08-12 DIAGNOSIS — N840 Polyp of corpus uteri: Secondary | ICD-10-CM | POA: Diagnosis not present

## 2021-08-12 DIAGNOSIS — Z30430 Encounter for insertion of intrauterine contraceptive device: Secondary | ICD-10-CM | POA: Diagnosis not present

## 2021-08-19 DIAGNOSIS — F411 Generalized anxiety disorder: Secondary | ICD-10-CM | POA: Diagnosis not present

## 2021-08-26 DIAGNOSIS — D225 Melanocytic nevi of trunk: Secondary | ICD-10-CM | POA: Diagnosis not present

## 2021-08-26 DIAGNOSIS — L821 Other seborrheic keratosis: Secondary | ICD-10-CM | POA: Diagnosis not present

## 2021-08-26 DIAGNOSIS — L578 Other skin changes due to chronic exposure to nonionizing radiation: Secondary | ICD-10-CM | POA: Diagnosis not present

## 2021-08-26 DIAGNOSIS — L814 Other melanin hyperpigmentation: Secondary | ICD-10-CM | POA: Diagnosis not present

## 2021-11-23 DIAGNOSIS — R051 Acute cough: Secondary | ICD-10-CM | POA: Diagnosis not present

## 2021-11-23 DIAGNOSIS — J00 Acute nasopharyngitis [common cold]: Secondary | ICD-10-CM | POA: Diagnosis not present

## 2021-12-13 DIAGNOSIS — K37 Unspecified appendicitis: Secondary | ICD-10-CM | POA: Diagnosis not present

## 2021-12-13 DIAGNOSIS — K6389 Other specified diseases of intestine: Secondary | ICD-10-CM | POA: Diagnosis not present

## 2021-12-13 DIAGNOSIS — R1031 Right lower quadrant pain: Secondary | ICD-10-CM | POA: Diagnosis not present

## 2021-12-13 DIAGNOSIS — K3532 Acute appendicitis with perforation and localized peritonitis, without abscess: Secondary | ICD-10-CM | POA: Diagnosis not present

## 2021-12-14 DIAGNOSIS — K353 Acute appendicitis with localized peritonitis, without perforation or gangrene: Secondary | ICD-10-CM | POA: Diagnosis not present

## 2021-12-14 DIAGNOSIS — K3532 Acute appendicitis with perforation and localized peritonitis, without abscess: Secondary | ICD-10-CM | POA: Diagnosis not present

## 2021-12-14 DIAGNOSIS — K3580 Unspecified acute appendicitis: Secondary | ICD-10-CM | POA: Diagnosis not present

## 2021-12-16 DIAGNOSIS — J90 Pleural effusion, not elsewhere classified: Secondary | ICD-10-CM | POA: Diagnosis not present

## 2021-12-16 DIAGNOSIS — R109 Unspecified abdominal pain: Secondary | ICD-10-CM | POA: Diagnosis not present

## 2021-12-16 DIAGNOSIS — T398X5A Adverse effect of other nonopioid analgesics and antipyretics, not elsewhere classified, initial encounter: Secondary | ICD-10-CM | POA: Diagnosis not present

## 2021-12-16 DIAGNOSIS — R5082 Postprocedural fever: Secondary | ICD-10-CM | POA: Diagnosis not present

## 2021-12-16 DIAGNOSIS — J9811 Atelectasis: Secondary | ICD-10-CM | POA: Diagnosis not present

## 2021-12-16 DIAGNOSIS — Z792 Long term (current) use of antibiotics: Secondary | ICD-10-CM | POA: Diagnosis not present

## 2021-12-16 DIAGNOSIS — Z888 Allergy status to other drugs, medicaments and biological substances status: Secondary | ICD-10-CM | POA: Diagnosis not present

## 2021-12-16 DIAGNOSIS — R748 Abnormal levels of other serum enzymes: Secondary | ICD-10-CM | POA: Diagnosis not present

## 2021-12-16 DIAGNOSIS — R509 Fever, unspecified: Secondary | ICD-10-CM | POA: Diagnosis not present

## 2021-12-16 DIAGNOSIS — R1031 Right lower quadrant pain: Secondary | ICD-10-CM | POA: Diagnosis not present

## 2021-12-16 DIAGNOSIS — R1030 Lower abdominal pain, unspecified: Secondary | ICD-10-CM | POA: Diagnosis not present

## 2021-12-16 DIAGNOSIS — Z79899 Other long term (current) drug therapy: Secondary | ICD-10-CM | POA: Diagnosis not present

## 2021-12-16 DIAGNOSIS — R079 Chest pain, unspecified: Secondary | ICD-10-CM | POA: Diagnosis not present

## 2021-12-22 DIAGNOSIS — R7989 Other specified abnormal findings of blood chemistry: Secondary | ICD-10-CM | POA: Diagnosis not present

## 2021-12-22 DIAGNOSIS — Z9049 Acquired absence of other specified parts of digestive tract: Secondary | ICD-10-CM | POA: Diagnosis not present

## 2021-12-25 DIAGNOSIS — N760 Acute vaginitis: Secondary | ICD-10-CM | POA: Diagnosis not present

## 2022-01-22 DIAGNOSIS — R748 Abnormal levels of other serum enzymes: Secondary | ICD-10-CM | POA: Diagnosis not present

## 2022-01-22 DIAGNOSIS — Z0000 Encounter for general adult medical examination without abnormal findings: Secondary | ICD-10-CM | POA: Diagnosis not present

## 2022-01-25 DIAGNOSIS — Z6826 Body mass index (BMI) 26.0-26.9, adult: Secondary | ICD-10-CM | POA: Diagnosis not present

## 2022-01-25 DIAGNOSIS — Z0000 Encounter for general adult medical examination without abnormal findings: Secondary | ICD-10-CM | POA: Diagnosis not present

## 2022-01-27 DIAGNOSIS — M25512 Pain in left shoulder: Secondary | ICD-10-CM | POA: Diagnosis not present

## 2022-01-27 DIAGNOSIS — M7542 Impingement syndrome of left shoulder: Secondary | ICD-10-CM | POA: Diagnosis not present

## 2022-02-05 DIAGNOSIS — M79622 Pain in left upper arm: Secondary | ICD-10-CM | POA: Diagnosis not present

## 2022-02-05 DIAGNOSIS — M7542 Impingement syndrome of left shoulder: Secondary | ICD-10-CM | POA: Diagnosis not present

## 2022-02-08 DIAGNOSIS — M7542 Impingement syndrome of left shoulder: Secondary | ICD-10-CM | POA: Diagnosis not present

## 2022-02-08 DIAGNOSIS — M79622 Pain in left upper arm: Secondary | ICD-10-CM | POA: Diagnosis not present

## 2022-02-12 DIAGNOSIS — M7542 Impingement syndrome of left shoulder: Secondary | ICD-10-CM | POA: Diagnosis not present

## 2022-02-12 DIAGNOSIS — M79622 Pain in left upper arm: Secondary | ICD-10-CM | POA: Diagnosis not present

## 2022-03-23 DIAGNOSIS — F411 Generalized anxiety disorder: Secondary | ICD-10-CM | POA: Diagnosis not present

## 2022-04-22 DIAGNOSIS — F411 Generalized anxiety disorder: Secondary | ICD-10-CM | POA: Diagnosis not present

## 2022-06-18 DIAGNOSIS — Z630 Problems in relationship with spouse or partner: Secondary | ICD-10-CM | POA: Diagnosis not present

## 2022-06-18 DIAGNOSIS — F4320 Adjustment disorder, unspecified: Secondary | ICD-10-CM | POA: Diagnosis not present

## 2022-07-06 DIAGNOSIS — Z630 Problems in relationship with spouse or partner: Secondary | ICD-10-CM | POA: Diagnosis not present

## 2022-07-06 DIAGNOSIS — F4320 Adjustment disorder, unspecified: Secondary | ICD-10-CM | POA: Diagnosis not present

## 2022-07-22 DIAGNOSIS — F411 Generalized anxiety disorder: Secondary | ICD-10-CM | POA: Diagnosis not present

## 2022-08-23 DIAGNOSIS — F4320 Adjustment disorder, unspecified: Secondary | ICD-10-CM | POA: Diagnosis not present

## 2022-09-14 DIAGNOSIS — F4320 Adjustment disorder, unspecified: Secondary | ICD-10-CM | POA: Diagnosis not present

## 2022-09-20 DIAGNOSIS — J019 Acute sinusitis, unspecified: Secondary | ICD-10-CM | POA: Diagnosis not present

## 2022-09-20 DIAGNOSIS — R0981 Nasal congestion: Secondary | ICD-10-CM | POA: Diagnosis not present

## 2022-09-23 DIAGNOSIS — F4320 Adjustment disorder, unspecified: Secondary | ICD-10-CM | POA: Diagnosis not present

## 2022-09-28 DIAGNOSIS — F4320 Adjustment disorder, unspecified: Secondary | ICD-10-CM | POA: Diagnosis not present

## 2022-10-07 DIAGNOSIS — D492 Neoplasm of unspecified behavior of bone, soft tissue, and skin: Secondary | ICD-10-CM | POA: Diagnosis not present

## 2022-10-12 DIAGNOSIS — F4320 Adjustment disorder, unspecified: Secondary | ICD-10-CM | POA: Diagnosis not present

## 2022-11-02 DIAGNOSIS — Z630 Problems in relationship with spouse or partner: Secondary | ICD-10-CM | POA: Diagnosis not present

## 2022-11-02 DIAGNOSIS — F4320 Adjustment disorder, unspecified: Secondary | ICD-10-CM | POA: Diagnosis not present

## 2022-11-09 DIAGNOSIS — F4320 Adjustment disorder, unspecified: Secondary | ICD-10-CM | POA: Diagnosis not present

## 2022-11-09 DIAGNOSIS — Z630 Problems in relationship with spouse or partner: Secondary | ICD-10-CM | POA: Diagnosis not present

## 2022-12-06 DIAGNOSIS — Z630 Problems in relationship with spouse or partner: Secondary | ICD-10-CM | POA: Diagnosis not present

## 2022-12-06 DIAGNOSIS — F4320 Adjustment disorder, unspecified: Secondary | ICD-10-CM | POA: Diagnosis not present

## 2022-12-17 DIAGNOSIS — M545 Low back pain, unspecified: Secondary | ICD-10-CM | POA: Diagnosis not present

## 2022-12-17 DIAGNOSIS — M5459 Other low back pain: Secondary | ICD-10-CM | POA: Diagnosis not present

## 2022-12-20 DIAGNOSIS — Z630 Problems in relationship with spouse or partner: Secondary | ICD-10-CM | POA: Diagnosis not present

## 2022-12-20 DIAGNOSIS — F4320 Adjustment disorder, unspecified: Secondary | ICD-10-CM | POA: Diagnosis not present

## 2023-01-03 DIAGNOSIS — F4320 Adjustment disorder, unspecified: Secondary | ICD-10-CM | POA: Diagnosis not present

## 2023-01-03 DIAGNOSIS — Z630 Problems in relationship with spouse or partner: Secondary | ICD-10-CM | POA: Diagnosis not present

## 2023-01-18 DIAGNOSIS — Z630 Problems in relationship with spouse or partner: Secondary | ICD-10-CM | POA: Diagnosis not present

## 2023-01-18 DIAGNOSIS — F4320 Adjustment disorder, unspecified: Secondary | ICD-10-CM | POA: Diagnosis not present

## 2023-01-25 DIAGNOSIS — F4320 Adjustment disorder, unspecified: Secondary | ICD-10-CM | POA: Diagnosis not present

## 2023-01-25 DIAGNOSIS — Z630 Problems in relationship with spouse or partner: Secondary | ICD-10-CM | POA: Diagnosis not present

## 2023-02-03 DIAGNOSIS — Z630 Problems in relationship with spouse or partner: Secondary | ICD-10-CM | POA: Diagnosis not present

## 2023-02-03 DIAGNOSIS — F4320 Adjustment disorder, unspecified: Secondary | ICD-10-CM | POA: Diagnosis not present

## 2023-03-01 DIAGNOSIS — F4320 Adjustment disorder, unspecified: Secondary | ICD-10-CM | POA: Diagnosis not present

## 2023-03-01 DIAGNOSIS — Z630 Problems in relationship with spouse or partner: Secondary | ICD-10-CM | POA: Diagnosis not present

## 2023-04-30 DIAGNOSIS — R051 Acute cough: Secondary | ICD-10-CM | POA: Diagnosis not present

## 2023-04-30 DIAGNOSIS — R0981 Nasal congestion: Secondary | ICD-10-CM | POA: Diagnosis not present

## 2023-04-30 DIAGNOSIS — J069 Acute upper respiratory infection, unspecified: Secondary | ICD-10-CM | POA: Diagnosis not present

## 2023-04-30 DIAGNOSIS — R0989 Other specified symptoms and signs involving the circulatory and respiratory systems: Secondary | ICD-10-CM | POA: Diagnosis not present

## 2023-07-18 DIAGNOSIS — R002 Palpitations: Secondary | ICD-10-CM | POA: Diagnosis not present

## 2023-07-19 DIAGNOSIS — R030 Elevated blood-pressure reading, without diagnosis of hypertension: Secondary | ICD-10-CM | POA: Diagnosis not present

## 2023-07-26 DIAGNOSIS — I341 Nonrheumatic mitral (valve) prolapse: Secondary | ICD-10-CM | POA: Diagnosis not present

## 2023-07-26 DIAGNOSIS — Z7689 Persons encountering health services in other specified circumstances: Secondary | ICD-10-CM | POA: Diagnosis not present

## 2023-07-26 DIAGNOSIS — R002 Palpitations: Secondary | ICD-10-CM | POA: Diagnosis not present

## 2023-07-30 DIAGNOSIS — R070 Pain in throat: Secondary | ICD-10-CM | POA: Diagnosis not present

## 2023-07-30 DIAGNOSIS — J101 Influenza due to other identified influenza virus with other respiratory manifestations: Secondary | ICD-10-CM | POA: Diagnosis not present

## 2023-08-04 DIAGNOSIS — R002 Palpitations: Secondary | ICD-10-CM | POA: Diagnosis not present

## 2023-08-08 DIAGNOSIS — F411 Generalized anxiety disorder: Secondary | ICD-10-CM | POA: Diagnosis not present

## 2023-08-16 DIAGNOSIS — R002 Palpitations: Secondary | ICD-10-CM | POA: Diagnosis not present

## 2023-09-05 DIAGNOSIS — H43391 Other vitreous opacities, right eye: Secondary | ICD-10-CM | POA: Diagnosis not present

## 2023-09-09 DIAGNOSIS — H20041 Secondary noninfectious iridocyclitis, right eye: Secondary | ICD-10-CM | POA: Diagnosis not present

## 2023-09-16 DIAGNOSIS — H20041 Secondary noninfectious iridocyclitis, right eye: Secondary | ICD-10-CM | POA: Diagnosis not present

## 2023-09-29 ENCOUNTER — Other Ambulatory Visit: Payer: Self-pay | Admitting: Family

## 2023-09-29 DIAGNOSIS — N6312 Unspecified lump in the right breast, upper inner quadrant: Secondary | ICD-10-CM | POA: Diagnosis not present

## 2023-10-03 ENCOUNTER — Ambulatory Visit
Admission: RE | Admit: 2023-10-03 | Discharge: 2023-10-03 | Disposition: A | Discharge status: Home / Self Care | Payer: Self-pay | Source: Ambulatory Visit | Attending: Family | Admitting: Family

## 2023-10-03 ENCOUNTER — Other Ambulatory Visit: Payer: Self-pay | Admitting: Family

## 2023-10-03 DIAGNOSIS — N6312 Unspecified lump in the right breast, upper inner quadrant: Secondary | ICD-10-CM

## 2023-10-03 DIAGNOSIS — N6310 Unspecified lump in the right breast, unspecified quadrant: Secondary | ICD-10-CM

## 2023-10-18 DIAGNOSIS — Z30432 Encounter for removal of intrauterine contraceptive device: Secondary | ICD-10-CM | POA: Diagnosis not present

## 2024-01-25 DIAGNOSIS — Z130 Encounter for screening for diseases of the blood and blood-forming organs and certain disorders involving the immune mechanism: Secondary | ICD-10-CM | POA: Diagnosis not present

## 2024-01-25 DIAGNOSIS — Z113 Encounter for screening for infections with a predominantly sexual mode of transmission: Secondary | ICD-10-CM | POA: Diagnosis not present

## 2024-01-25 DIAGNOSIS — Z1159 Encounter for screening for other viral diseases: Secondary | ICD-10-CM | POA: Diagnosis not present

## 2024-01-25 DIAGNOSIS — Z3141 Encounter for fertility testing: Secondary | ICD-10-CM | POA: Diagnosis not present

## 2024-01-25 DIAGNOSIS — Z114 Encounter for screening for human immunodeficiency virus [HIV]: Secondary | ICD-10-CM | POA: Diagnosis not present

## 2024-01-25 DIAGNOSIS — Z0183 Encounter for blood typing: Secondary | ICD-10-CM | POA: Diagnosis not present

## 2024-04-05 ENCOUNTER — Inpatient Hospital Stay: Admission: RE | Admit: 2024-04-05 | Source: Ambulatory Visit

## 2024-04-19 ENCOUNTER — Ambulatory Visit (HOSPITAL_BASED_OUTPATIENT_CLINIC_OR_DEPARTMENT_OTHER)

## 2024-04-19 DIAGNOSIS — R1024 Suprapubic pain: Secondary | ICD-10-CM | POA: Diagnosis not present

## 2024-05-07 ENCOUNTER — Ambulatory Visit (HOSPITAL_BASED_OUTPATIENT_CLINIC_OR_DEPARTMENT_OTHER): Payer: Self-pay

## 2024-05-07 DIAGNOSIS — J00 Acute nasopharyngitis [common cold]: Secondary | ICD-10-CM | POA: Diagnosis not present

## 2024-05-07 DIAGNOSIS — R030 Elevated blood-pressure reading, without diagnosis of hypertension: Secondary | ICD-10-CM | POA: Diagnosis not present

## 2024-05-07 DIAGNOSIS — M5489 Other dorsalgia: Secondary | ICD-10-CM | POA: Diagnosis not present
